# Patient Record
Sex: Female | Born: 1995 | Race: White | Hispanic: No | Marital: Single | Smoking: Never smoker
Health system: Southern US, Community
[De-identification: ages and names within clinical notes are randomized; demographics above are authoritative.]

---
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
## 2017-09-25 ENCOUNTER — Encounter (HOSPITAL_COMMUNITY): Payer: Self-pay | Admitting: Emergency Medicine

## 2017-09-25 ENCOUNTER — Encounter (HOSPITAL_COMMUNITY): Payer: Self-pay | Admitting: *Deleted

## 2017-09-25 ENCOUNTER — Emergency Department (HOSPITAL_COMMUNITY)
Admission: EM | Admit: 2017-09-25 | Discharge: 2017-09-25 | Disposition: A | Discharge status: Other Acute Inpatient Hospital | Payer: Self-pay | Attending: Emergency Medicine | Admitting: Emergency Medicine

## 2017-09-25 ENCOUNTER — Inpatient Hospital Stay (HOSPITAL_COMMUNITY)
Admission: AD | Admit: 2017-09-25 | Discharge: 2017-09-29 | DRG: 885 | Disposition: A | Discharge status: Home / Self Care | Payer: 59 | Attending: Psychiatry | Admitting: Psychiatry

## 2017-09-25 DIAGNOSIS — F419 Anxiety disorder, unspecified: Secondary | ICD-10-CM | POA: Diagnosis not present

## 2017-09-25 DIAGNOSIS — Z008 Encounter for other general examination: Secondary | ICD-10-CM

## 2017-09-25 DIAGNOSIS — F411 Generalized anxiety disorder: Secondary | ICD-10-CM | POA: Diagnosis present

## 2017-09-25 DIAGNOSIS — F332 Major depressive disorder, recurrent severe without psychotic features: Secondary | ICD-10-CM | POA: Diagnosis present

## 2017-09-25 DIAGNOSIS — G4700 Insomnia, unspecified: Secondary | ICD-10-CM | POA: Diagnosis present

## 2017-09-25 DIAGNOSIS — R630 Anorexia: Secondary | ICD-10-CM | POA: Insufficient documentation

## 2017-09-25 DIAGNOSIS — Z915 Personal history of self-harm: Secondary | ICD-10-CM

## 2017-09-25 DIAGNOSIS — R45851 Suicidal ideations: Secondary | ICD-10-CM | POA: Diagnosis present

## 2017-09-25 DIAGNOSIS — Z046 Encounter for general psychiatric examination, requested by authority: Secondary | ICD-10-CM | POA: Insufficient documentation

## 2017-09-25 DIAGNOSIS — F39 Unspecified mood [affective] disorder: Secondary | ICD-10-CM | POA: Diagnosis not present

## 2017-09-25 DIAGNOSIS — X789XXA Intentional self-harm by unspecified sharp object, initial encounter: Secondary | ICD-10-CM | POA: Insufficient documentation

## 2017-09-25 DIAGNOSIS — R450 Nervousness: Secondary | ICD-10-CM | POA: Diagnosis not present

## 2017-09-25 DIAGNOSIS — F322 Major depressive disorder, single episode, severe without psychotic features: Secondary | ICD-10-CM | POA: Diagnosis present

## 2017-09-25 DIAGNOSIS — R110 Nausea: Secondary | ICD-10-CM | POA: Insufficient documentation

## 2017-09-25 LAB — CBC WITH DIFFERENTIAL/PLATELET
Basophils Absolute: 0 10*3/uL (ref 0.0–0.1)
Basophils Relative: 0 %
EOS ABS: 0 10*3/uL (ref 0.0–0.7)
Eosinophils Relative: 0 %
HCT: 39.6 % (ref 36.0–46.0)
HEMOGLOBIN: 13.1 g/dL (ref 12.0–15.0)
LYMPHS ABS: 1.1 10*3/uL (ref 0.7–4.0)
LYMPHS PCT: 19 %
MCH: 29.6 pg (ref 26.0–34.0)
MCHC: 33.1 g/dL (ref 30.0–36.0)
MCV: 89.4 fL (ref 78.0–100.0)
Monocytes Absolute: 0.3 10*3/uL (ref 0.1–1.0)
Monocytes Relative: 5 %
NEUTROS PCT: 76 %
Neutro Abs: 4.5 10*3/uL (ref 1.7–7.7)
Platelets: 220 10*3/uL (ref 150–400)
RBC: 4.43 MIL/uL (ref 3.87–5.11)
RDW: 12.1 % (ref 11.5–15.5)
WBC: 6 10*3/uL (ref 4.0–10.5)

## 2017-09-25 LAB — RAPID URINE DRUG SCREEN, HOSP PERFORMED
AMPHETAMINES: NOT DETECTED
BARBITURATES: NOT DETECTED
BENZODIAZEPINES: NOT DETECTED
Cocaine: NOT DETECTED
Opiates: NOT DETECTED
TETRAHYDROCANNABINOL: NOT DETECTED

## 2017-09-25 LAB — COMPREHENSIVE METABOLIC PANEL
ALBUMIN: 4.3 g/dL (ref 3.5–5.0)
ALK PHOS: 53 U/L (ref 38–126)
ALT: 12 U/L — AB (ref 14–54)
AST: 17 U/L (ref 15–41)
Anion gap: 11 (ref 5–15)
BUN: 18 mg/dL (ref 6–20)
CALCIUM: 9.5 mg/dL (ref 8.9–10.3)
CO2: 22 mmol/L (ref 22–32)
CREATININE: 0.93 mg/dL (ref 0.44–1.00)
Chloride: 103 mmol/L (ref 101–111)
GFR calc non Af Amer: >60 mL/min (ref >60)
GLUCOSE: 79 mg/dL (ref 65–99)
Potassium: 4.3 mmol/L (ref 3.5–5.1)
SODIUM: 136 mmol/L (ref 135–145)
Total Bilirubin: 1 mg/dL (ref 0.3–1.2)
Total Protein: 7.7 g/dL (ref 6.5–8.1)

## 2017-09-25 LAB — SALICYLATE LEVEL: Salicylate Lvl: <7 mg/dL (ref 2.8–30.0)

## 2017-09-25 LAB — ETHANOL: Alcohol, Ethyl (B): <10 mg/dL (ref <10)

## 2017-09-25 LAB — I-STAT BETA HCG BLOOD, ED (MC, WL, AP ONLY)

## 2017-09-25 LAB — ACETAMINOPHEN LEVEL: Acetaminophen (Tylenol), Serum: <10 ug/mL — ABNORMAL LOW (ref 10–30)

## 2017-09-25 MED ORDER — TRAZODONE HCL 50 MG PO TABS
50.0000 mg | ORAL_TABLET | Freq: Every evening | ORAL | Status: DC | PRN
  Administered 2017-09-25: 50 mg via ORAL
  Filled 2017-09-25 (×2): qty 1

## 2017-09-25 MED ORDER — HYDROXYZINE HCL 25 MG PO TABS
25.0000 mg | ORAL_TABLET | Freq: Four times a day (QID) | ORAL | Status: DC | PRN
  Administered 2017-09-25: 25 mg via ORAL
  Filled 2017-09-25: qty 1

## 2017-09-25 MED ORDER — TETANUS-DIPHTH-ACELL PERTUSSIS 5-2.5-18.5 LF-MCG/0.5 IM SUSP: 0.5000 mL | Freq: Once | INTRAMUSCULAR | Status: DC

## 2017-09-25 MED ORDER — ALUM & MAG HYDROXIDE-SIMETH 200-200-20 MG/5ML PO SUSP: 30.0000 mL | Freq: Four times a day (QID) | ORAL | Status: DC | PRN

## 2017-09-25 MED ORDER — ONDANSETRON HCL 4 MG PO TABS: 4.0000 mg | ORAL_TABLET | Freq: Three times a day (TID) | ORAL | Status: DC

## 2017-09-25 MED ORDER — FAMOTIDINE 20 MG PO TABS
20.0000 mg | ORAL_TABLET | Freq: Every day | ORAL | Status: DC
  Administered 2017-09-25: 20 mg via ORAL
  Filled 2017-09-25: qty 1

## 2017-09-25 MED ORDER — MAGNESIUM HYDROXIDE 400 MG/5ML PO SUSP: 30.0000 mL | Freq: Every day | ORAL | Status: DC | PRN

## 2017-09-25 MED ORDER — HYDROXYZINE HCL 25 MG PO TABS
25.0000 mg | ORAL_TABLET | Freq: Three times a day (TID) | ORAL | Status: DC | PRN
  Administered 2017-09-27 – 2017-09-28 (×2): 25 mg via ORAL
  Filled 2017-09-25 (×2): qty 1

## 2017-09-25 MED ORDER — ZOLPIDEM TARTRATE 5 MG PO TABS: 5.0000 mg | ORAL_TABLET | Freq: Every evening | ORAL | Status: DC | PRN

## 2017-09-25 MED ORDER — ALUM & MAG HYDROXIDE-SIMETH 200-200-20 MG/5ML PO SUSP: 30.0000 mL | ORAL | Status: DC | PRN

## 2017-09-25 MED ORDER — ACETAMINOPHEN 325 MG PO TABS: 650.0000 mg | ORAL_TABLET | ORAL | Status: DC | PRN

## 2017-09-25 MED ORDER — CITALOPRAM HYDROBROMIDE 10 MG PO TABS
10.0000 mg | ORAL_TABLET | Freq: Every day | ORAL | Status: DC
  Administered 2017-09-25: 10 mg via ORAL
  Filled 2017-09-25: qty 1

## 2017-09-25 MED ORDER — IBUPROFEN 400 MG PO TABS
400.0000 mg | ORAL_TABLET | Freq: Four times a day (QID) | ORAL | Status: DC | PRN
  Administered 2017-09-25 – 2017-09-27 (×2): 400 mg via ORAL
  Filled 2017-09-25 (×2): qty 1

## 2017-09-25 NOTE — ED Provider Notes (Signed)
[facility]-EMERGENCY DEPT Provider Note   CSN: [id number] Arrival date & time: 09/25/17  1232     History   Chief Complaint Chief Complaint  Patient presents with  . Suicidal    IVC'd    HPI Diamond West is a 21 y.o. female who presents to the ED under IVC for SI with a plan to "bleed out" of a cut on her leg.  Patient states that over the last 6 months she has been under a lot of stress related to family and school, and has been feeling more depressed and suicidal.  2 days ago she decided to cut her left thigh superficially and intended to "bleed out" from this.  She has also previously attempted overdose several months ago but has not done so today.  Her father obtained IVC paperwork which states "respondent has not been eating and has just been wanting to stay in bed.  Respondent on the 09/23/17 texted a friend that she wanted to die and had thoughts of dying all the time.  Respondent had cut her left leg when police responded that night.  Respondent has lost a significant amount of weight and refuses to eat.  Respondent continues to make statements to father and stepmom about wanting to die.  Respondent has become reclusive and is dissociating from her family and friends."  She states that [facility] but is not prescribed any psychiatric medications.  She denies HI, AVH, drug use, alcohol use, or tobacco use.  She reports that she gets nauseous after eating and has lost her appetite due to this.  She denies any pain related to this.  She denies any other medical complaints at this time, and denies any symptoms of infection around the cuts on her leg.  She is unsure of her last tetanus shot.   The history is provided by the patient and medical records. No language interpreter was used.  Mental Health Problem  Presenting symptoms: depression, self-mutilation and suicidal thoughts   Presenting symptoms: no hallucinations and no homicidal  ideas   Patient accompanied by:  Law enforcement Onset quality:  Gradual Duration:  6 months Timing:  Constant Progression:  Worsening Chronicity:  Recurrent Context: stressful life event   Treatment compliance:  Untreated Relieved by:  None tried Exacerbated by: stress, family, school. Ineffective treatments:  None tried Associated symptoms: no abdominal pain and no chest pain   Risk factors: hx of suicide attempts   Risk factors: no recent psychiatric admission     History reviewed. No pertinent past medical history.  There are no active problems to display for this patient.   History reviewed. No pertinent surgical history.  OB History    No data available       Home Medications    Prior to Admission medications   Not on File    Family History No family history on file.  Social History Social History   Tobacco Use  . Smoking status: Never Smoker  Substance Use Topics  . Alcohol use: No  . Drug use: Not on file     Allergies   Patient has no known allergies.   Review of Systems Review of Systems  Constitutional: Negative for chills and fever.  Respiratory: Negative for shortness of breath.   Cardiovascular: Negative for chest pain.  Gastrointestinal: Positive for nausea (after eating). Negative for abdominal pain, constipation, diarrhea and vomiting.  Genitourinary: Negative for dysuria and hematuria.  Musculoskeletal: Negative  for arthralgias and myalgias.  Skin: Negative for color change.  Allergic/Immunologic: Negative for immunocompromised state.  Neurological: Negative for weakness and numbness.  Psychiatric/Behavioral: Positive for self-injury and suicidal ideas. Negative for confusion, hallucinations and homicidal ideas.   All other systems reviewed and are negative for acute change except as noted in the HPI.    Physical Exam Updated Vital Signs BP 113/81 (BP Location: Left Arm)   Pulse (!) 104   Temp 98.7 F (37.1 C) (Oral)   Resp  17   SpO2 98%   Physical Exam  Constitutional: She is oriented to person, place, and time. Vital signs are normal. She appears well-developed and well-nourished.  Non-toxic appearance. No distress.  Afebrile, nontoxic, NAD, thin, pleasant, very quiet  HENT:  Head: Normocephalic and atraumatic.  Mouth/Throat: Oropharynx is clear and moist and mucous membranes are normal.  Eyes: Conjunctivae and EOM are normal. Right eye exhibits no discharge. Left eye exhibits no discharge.  Neck: Normal range of motion. Neck supple.  Cardiovascular: Normal rate, regular rhythm, normal heart sounds and intact distal pulses. Exam reveals no gallop and no friction rub.  No murmur heard. Pulmonary/Chest: Effort normal and breath sounds normal. No respiratory distress. She has no decreased breath sounds. She has no wheezes. She has no rhonchi. She has no rales.  Abdominal: Soft. Normal appearance and bowel sounds are normal. She exhibits no distension. There is no tenderness. There is no rigidity, no rebound, no guarding, no CVA tenderness, no tenderness at McBurney's point and negative Murphy's sign.  Soft, NTND, +BS throughout, no r/g/r, neg murphy's, neg mcburney's, no CVA TTP   Musculoskeletal: Normal range of motion.  Neurological: She is alert and oriented to person, place, and time. She has normal strength. No sensory deficit.  Skin: Skin is warm and dry. Abrasion noted. No rash noted.  Small linear abrasions to L thigh, no surrounding erythema or warmth, no drainage, no signs of infection.   Psychiatric: She is not actively hallucinating. She exhibits a depressed mood. She expresses suicidal ideation. She expresses no homicidal ideation. She expresses suicidal plans. She expresses no homicidal plans.  Depressed affect, but pleasant and cooperative. Endorsing SI with a plan, denies HI or AVH, doesn't seem to be responding to internal stimuli.   Nursing note and vitals reviewed.    ED Treatments / Results    Labs (all labs ordered are listed, but only abnormal results are displayed) Labs Reviewed  COMPREHENSIVE METABOLIC PANEL - Abnormal; Notable for the following components:      Result Value   ALT 12 (*)    All other components within normal limits  ACETAMINOPHEN LEVEL - Abnormal; Notable for the following components:   Acetaminophen (Tylenol), Serum <10 (*)    All other components within normal limits  CBC WITH DIFFERENTIAL/PLATELET  ETHANOL  SALICYLATE LEVEL  RAPID URINE DRUG SCREEN, HOSP PERFORMED  I-STAT BETA HCG BLOOD, ED ([facility], [facility], [facility] ONLY)    EKG  EKG Interpretation None       Radiology No results found.  Procedures Procedures (including critical care time)  Medications Ordered in ED Medications  acetaminophen (TYLENOL) tablet 650 mg (not administered)  zolpidem (AMBIEN) tablet 5 mg (not administered)  ondansetron (ZOFRAN) tablet 4 mg (0 mg Oral Hold 09/25/17 1353)  alum & mag hydroxide-simeth (MAALOX/MYLANTA) 200-200-20 MG/5ML suspension 30 mL (not administered)  famotidine (PEPCID) tablet 20 mg (0 mg Oral Hold 09/25/17 1353)  Tdap (BOOSTRIX) injection 0.5 mL (0.5 mLs Intramuscular Not Given 09/25/17 1357)  Initial Impression / Assessment and Plan / ED Course  I have reviewed the triage vital signs and the nursing notes.  Pertinent labs & imaging results that were available during my care of the patient were reviewed by me and considered in my medical decision making (see chart for details).     21 y.o. female here under IVC due to SI with a plan to cut herself. Cut her LLE (superficial abrasions) 2 days ago, has hx of attempted OD a few months ago. Denies HI/AVH, no drugs/EtOH/tobacco. Has had loss of appetite due to postprandial nausea, no pain with it. Otherwise no medical complaints. No psych meds at home, [facility]. On exam, pleasant and cooperative, depressed affect, very quiet, thin, no abdominal tenderness. Small superficial  abrasions to LLE, no surrounding signs of infection. Will update Tdap, get screening labs, and TTS consult. For the postprandial nausea, will pre-treat with zofran TID with meals, and start on pepcid while here. Discussed that she needs to f/up outpatient for further work up of this. Suspect it's possible that some psychologic condition such as anorexia could also be to blame, will defer to psych for this discussion/assessment. Will reassess after labs return.   3:01 PM CBC w/diff WNL. CMP WNL. EtOH level undetectable. Salicylate and acetaminophen levels WNL. BetaHCG neg. UDS neg. Pt medically cleared at this time. Psych hold orders and home med orders placed. Please see TTS notes for further documentation of care/dispo. Pt stable at time of med clearance.     Final Clinical Impressions(s) / ED Diagnoses   Final diagnoses:  Suicidal ideation  Involuntary commitment  Medical clearance for psychiatric admission  Postprandial nausea    ED Discharge Orders    [address]       Clyde Zarrella, [place]cedes, [place]PA-C 09/25/17 1501    Alvira MondaySchlossman, Erin, MD 09/27/17 [phone]

## 2017-09-25 NOTE — ED Notes (Signed)
Report called to behavioral health, nurse Karen KitchensBobbie.  Patient to be transported by police after 7pm.

## 2017-09-25 NOTE — ED Notes (Signed)
Orders received to transfer patient. Pt aware and discharge summery reviewed with patient as well as review of medications. All personal items return and patient signed all discharge paperwork. Pt escorted off unit with sheriff [facility].

## 2017-09-25 NOTE — ED Notes (Signed)
Police called for transport after 7:30.

## 2017-09-25 NOTE — ED Notes (Signed)
TTS at bedside. 

## 2017-09-25 NOTE — ED Notes (Signed)
Patient ate 50% of her dinner.  Taking fluids without difficulty.

## 2017-09-25 NOTE — ED Triage Notes (Signed)
[facility]. States that patient has not been eating and has been wanting to stay in bed. Reports that patient text a friend about thoughts of wanting to hurt self. Has lost weight. Cut self on left leg. Suicidal with plan to "bleed out".

## 2017-09-25 NOTE — BH Assessment (Signed)
[facility] Assessment Progress Note  Case was staffed with Lord DNP who recommended a inpatient admission as appropriate bed placement is investigated.         

## 2017-09-25 NOTE — ED Notes (Addendum)
SBAR Report received from previous nurse. Pt received calm and visible on unit. Pt  appears otherwise stable and free of distress. Report and transport arranged by previous RN awaiting [employer] for transport. Pt reminded of camera surveillance, q 15 min rounds, and rules of the milieu. Will continue to assess.

## 2017-09-25 NOTE — BH Assessment (Addendum)
Assessment Note  Diamond West is an 21 y.o. female that presents this date with IVC. Per IVC: " Respondent has not been eating and just wanting to stay in bed. Respondent on 09/23/17 text a friend that she wanted to die and had thoughts of dying all the time. Respondent had cut her left leg when police responded that night. Respondent has lost weight due to not eating. Respondent continues to make statements to father and step mom about wanting to die". Patient presents with S/I this date reporting that she cut her leg with the intent to "bleed out." Patient states she continues to have thoughts of self harm at the time of admission. Patient reports she has never been hospitalized but states she has attempted to cut herself "4 or more times in her life." Patient states all the cuts were superficial although at the time patient reports that she "thought they were enough to end her life." Patient denies any history of OP treatment or medication management. Patient reports she feels she has been "depressed for years" with symptoms increasing within the last year due to school stress. Patient reports symptoms to include: anhedonia, fatigue and isolating. Patient also reports decreased appetite although is unsure in reference to weight loss. Patient is time/place oriented and denies any H/I or AVH.  Patient reports that she is currently seeing a counselor at [facility] but has only seen them "a couple times." Patient states she has been unwilling to seek treatment because "she really doesn't care." Patient is observed to be tearful during assessment and states she would like to "get some help I guess." Patient is open to medication and therapeutic  Interventions. Patient renders limited history and seems to disassociate at times. Per notes, "Patient presents to the ED under IVC for SI with a plan to "bleed out" of a cut on her leg. Patient states that over the last 6 months she has been under a lot of stress  related to family and school, and has been feeling more depressed and suicidal.  Two days ago she decided to cut her left thigh superficially and intended to "bleed out" from this.  She has also previously attempted overdose several months ago but has not done so today. Her father obtained IVC paperwork which states "respondent has not been eating and has just been wanting to stay in bed. Respondent on the 09/23/17 texted a friend that she wanted to die and had thoughts of dying all the time. Respondent had cut her left leg when police responded that night. Respondent has lost a significant amount of weight and refuses to eat. Respondent continues to make statements to father and step mom about wanting to die. Respondent has become reclusive and is dissociating from her family and friends." She states that she sees a Veterinary surgeon at [facility] but is not prescribed any psychiatric medications. She denies HI, AVH, drug use, alcohol use, or tobacco use. She reports that she gets nauseous after eating and has lost her appetite due to this".  Case was staffed with Shaune Pollack DNP who recommended a inpatient admission as appropriate bed placement is investigated.     Diagnosis: F33.2  MDD recurrent severe without psychotic features  Past Medical History: History reviewed. No pertinent past medical history.  History reviewed. No pertinent surgical history.  Family History: No family history on file.  Social History:  reports that  has never smoked. She does not have any smokeless tobacco history on file. She reports that  she does not drink alcohol. Her drug history is not on file.  Additional Social History:  Alcohol / Drug Use Pain Medications: See MAR Prescriptions: See MAR Over the Counter: See MAR History of alcohol / drug use?: No history of alcohol / drug abuse Longest period of sobriety (when/how long): NA Negative Consequences of Use: (NA) Withdrawal Symptoms: (NA)  CIWA: CIWA-Ar BP: 113/81 Pulse  Rate: (!) 104 COWS:    Allergies: No Known Allergies  Home Medications:  (Not in a hospital admission)  OB/GYN Status:  No LMP recorded.  General Assessment Data Location of Assessment: [facility] ED TTS Assessment: In system Is this a Tele or Face-to-Face Assessment?: Face-to-Face Is this an Initial Assessment or a Re-assessment for this encounter?: Initial Assessment Marital status: Single Maiden name: NA Is patient pregnant?: No Pregnancy Status: No Living Arrangements: Non-relatives/Friends Can pt return to current living arrangement?: Yes Admission Status: Involuntary Is patient capable of signing voluntary admission?: Yes Referral Source: Self/Family/Friend Insurance type: [employer]  Medical Screening Exam [facility] Walk-in ONLY) Medical Exam completed: Yes  Crisis Care Plan Living Arrangements: Non-relatives/Friends Legal Guardian: (NA) Name of Psychiatrist: None [facility]  Education Status Is patient currently in school?: Yes Current Grade: (3rd year college) Highest grade of school patient has completed: (2nd year) Name of school: [employer]) Contact person: NA  Risk to self with the past 6 months Suicidal Ideation: Yes-Currently Present Has patient been a risk to self within the past 6 months prior to admission? : Yes Suicidal Intent: Yes-Currently Present Has patient had any suicidal intent within the past 6 months prior to admission? : Yes Is patient at risk for suicide?: Yes Suicidal Plan?: Yes-Currently Present Has patient had any suicidal plan within the past 6 months prior to admission? : No Specify Current Suicidal Plan: Cut right leg Access to Means: Yes Specify Access to Suicidal Means: Knife from home What has been your use of drugs/alcohol within the last 12 months?: Denies Previous Attempts/Gestures: Yes How many times?: 4 Other Self Harm Risks: NA Triggers for Past Attempts: Other (Comment)(School issues) Intentional Self Injurious  Behavior: None Family Suicide History: No Recent stressful life event(s): Other (Comment)(School issues) Persecutory voices/beliefs?: No Depression: Yes Depression Symptoms: Feeling worthless/self pity Substance abuse history and/or treatment for substance abuse?: No Suicide prevention information given to non-admitted patients: Not applicable  Risk to Others within the past 6 months Homicidal Ideation: No Does patient have any lifetime risk of violence toward others beyond the six months prior to admission? : No Thoughts of Harm to Others: No Current Homicidal Intent: No Current Homicidal Plan: No Access to Homicidal Means: No Identified Victim: NA History of harm to others?: No Assessment of Violence: None Noted Violent Behavior Description: NA Does patient have access to weapons?: No Criminal Charges Pending?: No Does patient have a court date: No Is patient on probation?: No  Psychosis Hallucinations: None noted Delusions: None noted  Mental Status Report Appearance/Hygiene: In scrubs Eye Contact: Fair Motor Activity: Freedom of movement Speech: Slow Level of Consciousness: Quiet/awake Mood: Sad Affect: Depressed Anxiety Level: Moderate Thought Processes: Coherent, Relevant Judgement: Unimpaired Orientation: Person, Place, Time Obsessive Compulsive Thoughts/Behaviors: None  Cognitive Functioning Concentration: Normal Memory: Recent Intact, Remote Intact IQ: Average Insight: Fair Impulse Control: Poor Appetite: Poor Weight Loss: 5 Weight Gain: 0 Sleep: Decreased Total Hours of Sleep: 5 Vegetative Symptoms: None  ADLScreening [facility] Assessment [facility]) Patient's cognitive ability adequate to safely complete daily activities?: Yes Patient able to express need  for assistance with ADLs?: Yes Independently performs ADLs?: Yes (appropriate for developmental age)  Prior Inpatient Therapy Prior Inpatient Therapy: No Prior Therapy Dates: NA Prior Therapy  Facilty/Provider(s): NA Reason for Treatment: NA  Prior Outpatient Therapy Prior Outpatient Therapy: No Prior Therapy Dates: NA Prior Therapy Facilty/Provider(s): NA Reason for Treatment: NA Does patient have an ACCT team?: No Does patient have Intensive In-House Services?  : No Does patient have [facility] services? : No Does patient have P4CC services?: No  ADL Screening (condition at time of admission) Patient's cognitive ability adequate to safely complete daily activities?: Yes Is the patient deaf or have difficulty hearing?: No Does the patient have difficulty seeing, even when wearing glasses/contacts?: No Does the patient have difficulty concentrating, remembering, or making decisions?: No Patient able to express need for assistance with ADLs?: Yes Does the patient have difficulty dressing or bathing?: No Independently performs ADLs?: Yes (appropriate for developmental age) Does the patient have difficulty walking or climbing stairs?: No Weakness of Legs: None Weakness of Arms/Hands: None  Home Assistive Devices/Equipment Home Assistive Devices/Equipment: None  Therapy Consults (therapy consults require a physician order) PT Evaluation Needed: No OT Evalulation Needed: No SLP Evaluation Needed: No Abuse/Neglect Assessment (Assessment to be complete while patient is alone) Physical Abuse: Denies Verbal Abuse: Denies Sexual Abuse: Denies Exploitation of patient/patient's resources: Denies Self-Neglect: Denies Values / Beliefs Cultural Requests During Hospitalization: None Spiritual Requests During Hospitalization: None Consults Spiritual Care Consult Needed: No Social Work Consult Needed: No Merchant navy officerAdvance Directives (For Healthcare) Does Patient Have a Medical Advance Directive?: No Would patient like information on creating a medical advance directive?: No - Patient declined    Additional Information 1:1 In Past 12 Months?: No CIRT Risk: No Elopement Risk: No Does  patient have medical clearance?: Yes     Disposition: Case was staffed with Shaune PollackLord DNP who recommended a inpatient admission as appropriate bed placement is investigated.  Disposition Initial Assessment Completed for this Encounter: Yes Disposition of Patient: Inpatient treatment program Type of inpatient treatment program: Adult  On Site Evaluation by:   Reviewed with Physician:    Alfredia Fergusonavid L Morghan Kester 09/25/2017 3:24 PM

## 2017-09-26 ENCOUNTER — Other Ambulatory Visit: Payer: Self-pay

## 2017-09-26 DIAGNOSIS — F332 Major depressive disorder, recurrent severe without psychotic features: Principal | ICD-10-CM

## 2017-09-26 DIAGNOSIS — R450 Nervousness: Secondary | ICD-10-CM

## 2017-09-26 DIAGNOSIS — F419 Anxiety disorder, unspecified: Secondary | ICD-10-CM

## 2017-09-26 MED ORDER — CITALOPRAM HYDROBROMIDE 10 MG PO TABS
10.0000 mg | ORAL_TABLET | Freq: Every day | ORAL | Status: DC
  Administered 2017-09-26 – 2017-09-29 (×4): 10 mg via ORAL
  Filled 2017-09-26 (×7): qty 1

## 2017-09-26 NOTE — Progress Notes (Signed)
Patient ID: Diamond CutterVictoria B West, female   DOB: [birth year], 21 y.o.   MRN: [id number]   D: Patient complaining of headache and anxiety when brought back to the unit. Ibuprofen and Trazodone given at this time to help headache and to sleep. Does have vistaril if anxious while out of bed.  A: Staff will monitor on q 15 minute checks, follow treatment plan, and give medication as ordered. R: Cooperative on the unit.

## 2017-09-26 NOTE — Tx Team (Signed)
Initial Treatment Plan 09/26/2017 12:05 AM Diamond West ZOX:[id number]    PATIENT STRESSORS: Educational concerns Marital or family conflict   PATIENT STRENGTHS: Ability for insight Average or above average intelligence Capable of independent living General fund of knowledge Motivation for treatment/growth Supportive family/friends   PATIENT IDENTIFIED PROBLEMS: Depression Anxiety Suicidal thoughts "Suicidal thoughts are a big thing and I have really bad anxiety"                     DISCHARGE CRITERIA:  Ability to meet basic life and health needs Improved stabilization in mood, thinking, and/or behavior Reduction of life-threatening or endangering symptoms to within safe limits Verbal commitment to aftercare and medication compliance  PRELIMINARY DISCHARGE PLAN: Attend aftercare/continuing care group Return to previous living arrangement  PATIENT/FAMILY INVOLVEMENT: This treatment plan has been presented to and reviewed with the patient, Diamond West, and/or family member, .  The patient and family have been given the opportunity to ask questions and make suggestions.  [name], [place], [name] 09/26/2017, 12:05 AM

## 2017-09-26 NOTE — H&P (Addendum)
Psychiatric Admission Assessment Adult  Patient Identification: Diamond West MRN:  [id number] Date of Evaluation:  09/26/2017 Chief Complaint: " I called my dad asking for help" Principal Diagnosis: MDD, no psychotic symptoms.   Diagnosis:   Patient Active Problem List   Diagnosis Date Noted  . Major depressive disorder, single episode, severe without psychotic features ([place]) [F32.2] 09/25/2017  . Severe recurrent major depression without psychotic features [facility]) [F33.2] 09/25/2017   History of Present Illness: Patient is 21 year old female, college student, currently staying with her father  for Winter break. Admitted under commitment generated by her father. States she has a history of depression, anxiety.States she recently called her parents due to worsening depression and recent episode of self cutting . States she has a history of one prior episode of self cutting two years ago. Endorses passive SI, such as  Thinking " I hope I do not wake up", but denies actual suicidal plans or intention. States that in addition to depression, she feels that anxiety has been a major issue for her as well . States she feels she worries excessively . Endorses neuro-vegetative symptoms of depression as below, including significant weight loss recently . Identifies recent relationship break up and recent final exams/academic stressors as contributors to her depression.   Associated Signs/Symptoms: Depression Symptoms:  depressed mood, anhedonia, insomnia, suicidal thoughts with specific plan, anxiety, loss of energy/fatigue, decreased appetite, has lost 10 lbs over recent weeks (Hypo) Manic Symptoms:  Denies  Anxiety Symptoms: increased anxiety, denies panic attacks, denies agoraphobia. Psychotic Symptoms:    Denies  PTSD Symptoms: Denies  Total Time spent with patient: 45 minutes  Past Psychiatric History: no prior psychiatric admissions, reports history of depression particularly over  the last 6 months, denies history of mania, hypomania . Denies history of psychosis. Denies history of violence. Does not endorse history of PTSD . Denies history of eating disorder.  Denies history of repeated self cutting, but states she has cut self once before (two years ago)   Is the patient at risk to self? Yes.    Has the patient been a risk to self in the past 6 months? Yes.    Has the patient been a risk to self within the distant past? Yes.    Is the patient a risk to others? No.  Has the patient been a risk to others in the past 6 months? No.  Has the patient been a risk to others within the distant past? No.   Prior Inpatient Therapy:  denies  Prior Outpatient Therapy:  has been going to a counselor at [facility] over recent months   Alcohol Screening: 1. How often do you have a drink containing alcohol?: Monthly or less 2. How many drinks containing alcohol do you have on a typical day when you are drinking?: 1 or 2 3. How often do you have six or more drinks on one occasion?: Never AUDIT-C Score: 1 4. How often during the last year have you found that you were not able to stop drinking once you had started?: Never 5. How often during the last year have you failed to do what was normally expected from you becasue of drinking?: Never 6. How often during the last year have you needed a first drink in the morning to get yourself going after a heavy drinking session?: Never 7. How often during the last year have you had a feeling of guilt of remorse after drinking?: Never 8. How often during  the last year have you been unable to remember what happened the night before because you had been drinking?: Never 9. Have you or someone else been injured as a result of your drinking?: No 10. Has a relative or friend or a doctor or another health worker been concerned about your drinking or suggested you cut down?: No Alcohol Use Disorder Identification Test Final Score (AUDIT):  1 Intervention/Follow-up: AUDIT Score <7 follow-up not indicated Substance Abuse History in the last 12 months:  Denies alcohol or drug abuse  Consequences of Substance Abuse: Denies  Previous Psychotropic Medications: states she was on Abilify briefly ( one week) 2 years ago. No other psychiatric medication trials  Psychological Evaluations:  No  Past Medical History:  Denies medical illnesses , NKDA, does not smoke or use tobacco products  Family History: Parents are divorced, states she is closer to father, has one older sister, and three half siblings  Family Psychiatric  History:  States half sister has history of Eating Disorder, no suicides in family, no substance abuse in family  Tobacco Screening: Have you used any form of tobacco in the last 30 days? (Cigarettes, Smokeless Tobacco, Cigars, and/or Pipes): No Social History: 21 year old single female, no children, in college [facility] [employer], currently on winter break , lives in college housing but is currently staying with parents . Employed in [place] .  Social History   Substance and Sexual Activity  Alcohol Use No     Social History   Substance and Sexual Activity  Drug Use No    Additional Social History:  Allergies:  No Known Allergies Lab Results:  Results for orders placed or performed during the hospital encounter of 09/25/17 (from the past 48 hour(s))  Urine rapid drug screen (hosp performed)     Status: None   Collection Time: 09/25/17  1:35 PM  Result Value Ref Range   Opiates NONE DETECTED NONE DETECTED   Cocaine NONE DETECTED NONE DETECTED   Benzodiazepines NONE DETECTED NONE DETECTED   Amphetamines NONE DETECTED NONE DETECTED   Tetrahydrocannabinol NONE DETECTED NONE DETECTED   Barbiturates NONE DETECTED NONE DETECTED    Comment:        DRUG SCREEN FOR MEDICAL PURPOSES ONLY.  IF CONFIRMATION IS NEEDED FOR ANY PURPOSE, NOTIFY LAB WITHIN 5 DAYS.        LOWEST DETECTABLE LIMITS FOR URINE DRUG  SCREEN Drug Class       Cutoff (ng/mL) Amphetamine      1000 Barbiturate      200 Benzodiazepine   240 Tricyclics       973 Opiates          300 Cocaine          300 THC              50   CBC w/diff     Status: None   Collection Time: 09/25/17  1:36 PM  Result Value Ref Range   WBC 6.0 4.0 - 10.5 K/uL   RBC 4.43 3.87 - 5.11 MIL/uL   Hemoglobin 13.1 12.0 - 15.0 g/dL   HCT 39.6 36.0 - 46.0 %   MCV 89.4 78.0 - 100.0 fL   MCH 29.6 26.0 - 34.0 pg   MCHC 33.1 30.0 - 36.0 g/dL   RDW 12.1 11.5 - 15.5 %   Platelets 220 150 - 400 K/uL   Neutrophils Relative % 76 %   Neutro Abs 4.5 1.7 - 7.7 K/uL   Lymphocytes Relative  19 %   Lymphs Abs 1.1 0.7 - 4.0 K/uL   Monocytes Relative 5 %   Monocytes Absolute 0.3 0.1 - 1.0 K/uL   Eosinophils Relative 0 %   Eosinophils Absolute 0.0 0.0 - 0.7 K/uL   Basophils Relative 0 %   Basophils Absolute 0.0 0.0 - 0.1 K/uL  Comprehensive metabolic panel     Status: Abnormal   Collection Time: 09/25/17  1:36 PM  Result Value Ref Range   Sodium 136 135 - 145 mmol/L   Potassium 4.3 3.5 - 5.1 mmol/L   Chloride 103 101 - 111 mmol/L   CO2 22 22 - 32 mmol/L   Glucose, Bld 79 65 - 99 mg/dL   BUN 18 6 - 20 mg/dL   Creatinine, Ser 0.93 0.44 - 1.00 mg/dL   Calcium 9.5 8.9 - 10.3 mg/dL   Total Protein 7.7 6.5 - 8.1 g/dL   Albumin 4.3 3.5 - 5.0 g/dL   AST 17 15 - 41 U/L   ALT 12 (L) 14 - 54 U/L   Alkaline Phosphatase 53 38 - 126 U/L   Total Bilirubin 1.0 0.3 - 1.2 mg/dL   GFR calc non Af Amer >60 >60 mL/min   GFR calc Af Amer >60 >60 mL/min    Comment: (NOTE) The eGFR has been calculated using the CKD EPI equation. This calculation has not been validated in all clinical situations. eGFR's persistently <60 mL/min signify possible Chronic Kidney Disease.    Anion gap 11 5 - 15  Ethanol     Status: None   Collection Time: 09/25/17  1:36 PM  Result Value Ref Range   Alcohol, Ethyl (B) <10 <10 mg/dL    Comment:        LOWEST DETECTABLE LIMIT FOR SERUM  ALCOHOL IS 10 mg/dL FOR MEDICAL PURPOSES ONLY   Salicylate level     Status: None   Collection Time: 09/25/17  1:36 PM  Result Value Ref Range   Salicylate Lvl <5.4 2.8 - 30.0 mg/dL  Acetaminophen level     Status: Abnormal   Collection Time: 09/25/17  1:36 PM  Result Value Ref Range   Acetaminophen (Tylenol), Serum <10 (L) 10 - 30 ug/mL    Comment:        THERAPEUTIC CONCENTRATIONS VARY SIGNIFICANTLY. A RANGE OF 10-30 ug/mL MAY BE AN EFFECTIVE CONCENTRATION FOR MANY PATIENTS. HOWEVER, SOME ARE BEST TREATED AT CONCENTRATIONS OUTSIDE THIS RANGE. ACETAMINOPHEN CONCENTRATIONS >150 ug/mL AT 4 HOURS AFTER INGESTION AND >50 ug/mL AT 12 HOURS AFTER INGESTION ARE OFTEN ASSOCIATED WITH TOXIC REACTIONS.   I-Stat beta hCG blood, ED ([facility], [facility], [facility] only)     Status: None   Collection Time: 09/25/17  1:50 PM  Result Value Ref Range   I-stat hCG, quantitative <5.0 <5 mIU/mL   Comment 3            Comment:   GEST. AGE      CONC.  (mIU/mL)   <=1 WEEK        5 - 50     2 WEEKS       50 - 500     3 WEEKS       100 - 10,000     4 WEEKS     1,000 - 30,000        FEMALE AND NON-PREGNANT FEMALE:     LESS THAN 5 mIU/mL     Blood Alcohol level:  Lab Results  Component Value Date   ETH <10 09/25/2017  Metabolic Disorder Labs:  No results found for: HGBA1C, MPG No results found for: PROLACTIN No results found for: CHOL, TRIG, HDL, CHOLHDL, VLDL, LDLCALC  Current Medications: Current Facility-Administered Medications  Medication Dose Route Frequency Provider Last Rate Last Dose  . alum & mag hydroxide-simeth (MAALOX/MYLANTA) 200-200-20 MG/5ML suspension 30 mL  30 mL Oral Q4H PRN Lindon Romp A, NP      . hydrOXYzine (ATARAX/VISTARIL) tablet 25 mg  25 mg Oral TID PRN Lindon Romp A, NP      . ibuprofen (ADVIL,MOTRIN) tablet 400 mg  400 mg Oral Q6H PRN Lindon Romp A, NP   400 mg at 09/25/17 2228  . magnesium hydroxide (MILK OF MAGNESIA) suspension 30 mL  30 mL Oral Daily PRN Lindon Romp  A, NP      . traZODone (DESYREL) tablet 50 mg  50 mg Oral QHS PRN Rozetta Nunnery, NP   50 mg at 09/25/17 2229   PTA Medications: Medications Prior to Admission  Medication Sig Dispense Refill Last Dose  . ibuprofen (ADVIL,MOTRIN) 200 MG tablet Take 400 mg by mouth every 6 (six) hours as needed for moderate pain.   Past Month at Unknown time  . JUNEL FE 1/20 1-20 MG-MCG tablet Take 1 tablet by mouth daily.  2 09/24/2017 at Unknown time    Musculoskeletal: Strength & Muscle Tone: within normal limits Gait & Station: normal Patient leans: N/A  Psychiatric Specialty Exam: Physical Exam  Review of Systems  Constitutional: Negative.   HENT: Negative.   Eyes: Negative.   Respiratory: Negative.   Cardiovascular: Negative.   Gastrointestinal: Positive for nausea. Negative for diarrhea and vomiting.  Genitourinary: Negative.   Musculoskeletal: Negative.   Skin: Negative.   Neurological: Negative for seizures.  Psychiatric/Behavioral: Positive for depression and suicidal ideas. The patient is nervous/anxious.   All other systems reviewed and are negative.   Blood pressure 102/66, pulse (!) 101, temperature 98.6 F (37 C), temperature source Oral, resp. rate 16, height 5' 2"  (1.575 m), weight 49.9 kg (110 lb).Body mass index is 20.12 kg/m.  General Appearance: Well Groomed  Eye Contact:  Good  Speech:  Normal Rate  Volume:  Normal  Mood:  states she feels better, denies feeling significantly depressed, states mood is "OK" today   Affect:  Appropriate and reactive  Thought Process:  Linear and Descriptions of Associations: Intact  Orientation:  Full (Time, Place, and Person)  Thought Content:  denies hallucinations, no delusions, not internally preoccupied   Suicidal Thoughts:  No, denies any suicidal or self injurious ideations, denies homicidal or violent ideations , contracts for safety on unit   Homicidal Thoughts:  No  Memory:  recent and remote grossly intact   Judgement:  Fair   Insight:  Fair  Psychomotor Activity:  Normal  Concentration:  Concentration: Good and Attention Span: Good  Recall:  Good  Fund of Knowledge:  Good  Language:  Good  Akathisia:  No  Handed:  Right  AIMS (if indicated):     Assets:  Desire for Improvement Resilience  ADL's:  Intact  Cognition:  WNL  Sleep:  Number of Hours: 5.75    Treatment Plan Summary: Daily contact with patient to assess and evaluate symptoms and progress in treatment, Medication management, Plan inpatient admission and medications as below  Observation Level/Precautions:  15 minute checks  Laboratory: TSH  Psychotherapy:  Milieu, group therapy   Medications:  We discussed options, agrees to start an antidepressant medication to address anxiety, depression Agrees  to CELEXA start at [place]. Side effects discussed   Consultations: as needed    Discharge Concerns:  -   Estimated LOS: 5 days  Other:     Physician Treatment Plan for Primary Diagnosis:  MDD, no psychotic features  Long Term Goal(s): Improvement in symptoms so as ready for discharge  Short Term Goals: Ability to identify changes in lifestyle to reduce recurrence of condition will improve and Ability to maintain clinical measurements within normal limits will improve  Physician Treatment Plan for Secondary Diagnosis: Consider Generalized Anxiety Disorder  Long Term Goal(s): Improvement in symptoms so as ready for discharge  Short Term Goals: Ability to verbalize feelings will improve, Ability to disclose and discuss suicidal ideas, Ability to demonstrate self-control will improve and Ability to identify and develop effective coping behaviors will improve  I certify that inpatient services furnished can reasonably be expected to improve the patient's condition.    Jenne Campus, MD 12/16/20184:03 PM

## 2017-09-26 NOTE — Progress Notes (Signed)
Patient shared with the group that she had a rough start to her day but that it ended well. The patient credited the positive turn around to the fact that she socialized more with her peers today. As for the theme of the day, her support system will be comprised of her church and friends.

## 2017-09-26 NOTE — Progress Notes (Signed)
Nursing Progress Note: 7-7p  D- Mood is depressed and anxious, pt felt fait and weak this am but after resting and pushing fluids reported feeling better after eating lunch. "I don't know why I felt so weak, if it was the sleeping medication or not eating yesterday." . Affect is blunted and appropriate. Pt is able to contract for safety.    A - Pt was lying down initially but did attend group with encouragement.Observed pt interacting in group and in the milieu.Support and encouragement offered, safety maintained with q 15 minutes.   R-Contracts for safety and continues to follow treatment plan, working on learning new coping skills for anxiety. Encouraged to increase food and fluid intake.

## 2017-09-26 NOTE — Progress Notes (Signed)
Diamond West is a 21 year old female pt admitted on involuntary basis. On admission, she spoke about depression, anxiety and recent suicidal thoughts. She spoke about how she is feeling ok on admission and denies any current SI and is able to contract for safety while in the hospital. She reports that she has a history of cutting and does have some fresh cuts to leg that were self-inflicted. She spoke about how she is having family and relationship issues along with school stressors. She reports that she is not on any medications currently but reports she was on abilify about a year ago by a doctor at her school but reports that she stopped it feeling it did not help her. She denies any substance abuse issues and reports that once she is discharged that she will probably go live with her parents. Diamond West was oriented to the unit and safety maintained.

## 2017-09-26 NOTE — Plan of Care (Signed)
  Progressing Safety: Periods of time without injury will increase 09/26/2017 2352 - Progressing by Arrie Aranhurch, Ramsie Ostrander J, RN Note Pt has not harmed self or others tonight.  She denies SI/HI and verbally contracts for safety.

## 2017-09-26 NOTE — [facility] Group Notes (Signed)
   Date:  09/26/2017  Time:  3:59 PM  Type of Therapy:  Nurse Education  /  The group focuses on teaching patients how to identify their primary emotion behind their anger and then how to devlop the skills needed to manage their anger.  Participation Level:  Active  Participation Quality:  Attentive  Affect:  Appropriate  Cognitive:  Alert  Insight:  Improving  Engagement in Group:  Engaged  Modes of Intervention:  Education  Summary of Progress/Problems:  Rich BraveDuke, Chaise Passarella Lynn 09/26/2017, 3:59 PM

## 2017-09-26 NOTE — [facility] Suicide Risk Assessment (Signed)
[facility]   Nursing information obtained from:   patient and chart  Demographic factors:   21 year old single female, college student Current Mental Status:   see below Loss Factors:   academic, relationship stressors  Historical Factors:   history of depression, anxiety, particularly over the last several months  Risk Reduction Factors:   resilience, family support   Total Time spent with patient: 45 minutes Principal Problem:  MDD , consider GAD Diagnosis:   Patient Active Problem List   Diagnosis Date Noted  . Major depressive disorder, single episode, severe without psychotic features ([facility]) [F32.2] 09/25/2017  . Severe recurrent major depression without psychotic features ([facility]) [F33.2] 09/25/2017     Continued Clinical Symptoms:  Alcohol Use Disorder Identification Test Final Score (AUDIT): 1 The "Alcohol Use Disorders Identification Test", Guidelines for Use in Primary Care, Second Edition.  World Science writerHealth Organization [facility](WHO). Score between 0-7:  no or low risk or alcohol related problems. Score between 8-15:  moderate risk of alcohol related problems. Score between 16-19:  high risk of alcohol related problems. Score 20 or above:  warrants further diagnostic evaluation for alcohol dependence and treatment.   CLINICAL FACTORS:  21 year old female, college student, reports she has been feeling increasingly depressed, anxious, and endorses some neuro-vegetative symptoms ( particularly poor appetite, weight loss) and passive SI. Recent episode of self inflicted cut, states did not require sutures. Reports feeling anxious and overwhelmed by stressors at times , mainly academic and college housing/roomate issues . Admitted under IVC generated by father.   Psychiatric Specialty Exam: Physical Exam  ROS  Blood pressure 102/66, pulse (!) 101, temperature 98.6 F (37 C), temperature source Oral, resp. rate 16, height 5\' 2"  (1.575 m), weight 49.9 kg (110  lb).Body mass index is 20.12 kg/m.   see admit note MSE    COGNITIVE FEATURES THAT CONTRIBUTE TO RISK:  Closed-mindedness and Loss of executive function    SUICIDE RISK:   Moderate:  Frequent suicidal ideation with limited intensity, and duration, some specificity in terms of plans, no associated intent, good self-control, limited dysphoria/symptomatology, some risk factors present, and identifiable protective factors, including available and accessible social support.  PLAN OF CARE: Patient will be admitted to inpatient psychiatric unit for stabilization and safety. Will provide and encourage milieu participation. Provide medication management and maked adjustments as needed.  Will follow daily.    I certify that inpatient services furnished can reasonably be expected to improve the patient's condition.   Craige CottaFernando A Katy Brickell, MD 09/26/2017, 4:37 PM

## 2017-09-26 NOTE — Progress Notes (Signed)
D: Pt was in the dayroom talking to peers upon initial approach.  Pt presents with appropriate affect and mood.  She describes her day as "good."  Her goal was to "socialize, get to know people here and I'm doing that."  Pt smiles as she is talking to Clinical research associatewriter.  Pt denies SI/HI, denies hallucinations, denies pain.  Pt has been visible in milieu interacting with peers and staff appropriately.  Pt attended evening group.    A: Introduced self to pt.  Actively listened to pt and offered support and encouragement. Q15 minute safety checks maintained.  R: Pt is safe on the unit.  Pt verbally contracts for safety.  Will continue to monitor and assess.

## 2017-09-26 NOTE — Progress Notes (Signed)
Patient ID: [name], female   DOB: 05-28-1996, 21 y.o.   MRN: [id number]  D: Patient was in dayroom obtaining vital signs and suddenly felt faint. Staff had to prevent her from falling. Patient looked pail on approach. Staff helped in bathroom. Patient starting feeling better. Given gatorade. Blood pressure went back up to 101/68 pulse- 106. Unknown if trazodone caused some of this morning drowsiness.  A: Staff will monitor frequently R: Reports better after drinking some gatorade and blood pressure retaken.

## 2017-09-26 NOTE — [facility] Group Notes (Signed)
[facility] LCSW Group Therapy Note  Date/Time:  09/26/2017  10:00-11:00AM  Type of Therapy and Topic:  Group Therapy:  Music and Mood  Participation Level:  Minimal   Description of Group: In this process group, members listened to a variety of genres of music and identified that different types of music evoke different responses.  Patients were encouraged to identify music that was soothing for them and music that was energizing for them.  Patients discussed how this knowledge can help with wellness and recovery in various ways including managing depression and anxiety as well as encouraging healthy sleep habits.    Therapeutic Goals: 1. Patients will explore the impact of different varieties of music on mood 2. Patients will verbalize the thoughts they have when listening to different types of music 3. Patients will identify music that is soothing to them as well as music that is energizing to them 4. Patients will discuss how to use this knowledge to assist in maintaining wellness and recovery 5. Patients will explore the use of music as a coping skill  Summary of Patient Progress:  Patient did not arrive to group until the last 5 minutes or so.  She smiled and said this was from the music because it made her feel good.  Therapeutic Modalities: Solution Focused Brief Therapy Motivational Interviewing Activity   Ambrose MantleMareida Grossman-Orr, LCSW 09/26/2017 11:28 AM

## 2017-09-27 DIAGNOSIS — F322 Major depressive disorder, single episode, severe without psychotic features: Secondary | ICD-10-CM

## 2017-09-27 DIAGNOSIS — F39 Unspecified mood [affective] disorder: Secondary | ICD-10-CM

## 2017-09-27 LAB — LIPID PANEL
Cholesterol: 249 mg/dL — ABNORMAL HIGH (ref 0–200)
HDL: 59 mg/dL (ref >40)
LDL CALC: 165 mg/dL — AB (ref 0–99)
TRIGLYCERIDES: 126 mg/dL (ref <150)
Total CHOL/HDL Ratio: 4.2 RATIO
VLDL: 25 mg/dL (ref 0–40)

## 2017-09-27 LAB — TSH: TSH: 1.977 u[IU]/mL (ref 0.350–4.500)

## 2017-09-27 NOTE — [facility] Group Notes (Signed)
[facility] LCSW Group Therapy Note  Date/Time: 09/27/17, 1315  Type of Therapy and Topic:  Group Therapy:  Overcoming Obstacles  Participation Level:  active  Description of Group:    In this group patients will be encouraged to explore what they see as obstacles to their own wellness and recovery. They will be guided to discuss their thoughts, feelings, and behaviors related to these obstacles. The group will process together ways to cope with barriers, with attention given to specific choices patients can make. Each patient will be challenged to identify changes they are motivated to make in order to overcome their obstacles. This group will be process-oriented, with patients participating in exploration of their own experiences as well as giving and receiving support and challenge from other group members.  Therapeutic Goals: 1. Patient will identify personal and current obstacles as they relate to admission. 2. Patient will identify barriers that currently interfere with their wellness or overcoming obstacles.  3. Patient will identify feelings, thought process and behaviors related to these barriers. 4. Patient will identify two changes they are willing to make to overcome these obstacles:    Summary of Patient Progress: Pt identified depression, anxiety, school, work and feeling stressed as obstacles in her life.  Pt participated in group discussion regarding taking care of self in the midst of a lot of responsibility and discussed coping skills to help manage stressful situations.        Therapeutic Modalities:   Cognitive Behavioral Therapy Solution Focused Therapy Motivational Interviewing Relapse Prevention Therapy  Daleen SquibbGreg Saveon [place], LCSW
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
## 2017-09-27 NOTE — Tx Team (Signed)
Interdisciplinary Treatment and Diagnostic Plan Update  09/27/2017 Time of Session: 1236 Diamond CutterVictoria B Kussman MRN: [id number]  Principal Diagnosis: <principal problem not specified>  Secondary Diagnoses: Active Problems:   Severe recurrent major depression without psychotic features (HCC)   Current Medications:  Current Facility-Administered Medications  Medication Dose Route Frequency Provider Last Rate Last Dose  . alum & mag hydroxide-simeth (MAALOX/MYLANTA) 200-200-20 MG/5ML suspension 30 mL  30 mL Oral Q4H PRN Nira ConnBerry, Jason A, NP      . citalopram (CELEXA) tablet 10 mg  10 mg Oral Daily Cobos, Rockey SituFernando A, MD   10 mg at 09/27/17 [id number]  . hydrOXYzine (ATARAX/VISTARIL) tablet 25 mg  25 mg Oral TID PRN Jackelyn PolingBerry, Jason A, NP      . ibuprofen (ADVIL,MOTRIN) tablet 400 mg  400 mg Oral Q6H PRN Nira ConnBerry, Jason A, NP   400 mg at 09/25/17 2228  . magnesium hydroxide (MILK OF MAGNESIA) suspension 30 mL  30 mL Oral Daily PRN Nira ConnBerry, Jason A, NP      . traZODone (DESYREL) tablet 50 mg  50 mg Oral QHS PRN Jackelyn PolingBerry, Jason A, NP   50 mg at 09/25/17 2229   PTA Medications: Medications Prior to Admission  Medication Sig Dispense Refill Last Dose  . ibuprofen (ADVIL,MOTRIN) 200 MG tablet Take 400 mg by mouth every 6 (six) hours as needed for moderate pain.   Past Month at Unknown time  . JUNEL FE 1/20 1-20 MG-MCG tablet Take 1 tablet by mouth daily.  2 09/24/2017 at Unknown time    Patient Stressors: Educational concerns Marital or family conflict  Patient Strengths: Ability for insight Average or above average intelligence Capable of independent living [employer] of knowledge Motivation for treatment/growth Supportive family/friends  Treatment Modalities: Medication Management, Group therapy, Case management,  1 to 1 session with clinician, Psychoeducation, Recreational therapy.   Physician Treatment Plan for Primary Diagnosis: <principal problem not specified> Long Term Goal(s): Improvement in  symptoms so as ready for discharge Improvement in symptoms so as ready for discharge   Short Term Goals: Ability to identify changes in lifestyle to reduce recurrence of condition will improve Ability to maintain clinical measurements within normal limits will improve Ability to verbalize feelings will improve Ability to disclose and discuss suicidal ideas Ability to demonstrate self-control will improve Ability to identify and develop effective coping behaviors will improve  Medication Management: Evaluate patient's response, side effects, and tolerance of medication regimen.  Therapeutic Interventions: 1 to 1 sessions, Unit Group sessions and Medication administration.  Evaluation of Outcomes: Progressing  Physician Treatment Plan for Secondary Diagnosis: Active Problems:   Severe recurrent major depression without psychotic features (HCC)  Long Term Goal(s): Improvement in symptoms so as ready for discharge Improvement in symptoms so as ready for discharge   Short Term Goals: Ability to identify changes in lifestyle to reduce recurrence of condition will improve Ability to maintain clinical measurements within normal limits will improve Ability to verbalize feelings will improve Ability to disclose and discuss suicidal ideas Ability to demonstrate self-control will improve Ability to identify and develop effective coping behaviors will improve     Medication Management: Evaluate patient's response, side effects, and tolerance of medication regimen.  Therapeutic Interventions: 1 to 1 sessions, Unit Group sessions and Medication administration.  Evaluation of Outcomes: Progressing   RN Treatment Plan for Primary Diagnosis: <principal problem not specified> Long Term Goal(s): Knowledge of disease and therapeutic regimen to maintain health will improve  Short Term Goals: Ability to identify and  develop effective coping behaviors will improve and Compliance with prescribed  medications will improve  Medication Management: RN will administer medications as ordered by provider, will assess and evaluate patient's response and provide education to patient for prescribed medication. RN will report any adverse and/or side effects to prescribing provider.  Therapeutic Interventions: 1 on 1 counseling sessions, Psychoeducation, Medication administration, Evaluate responses to treatment, Monitor vital signs and CBGs as ordered, Perform/monitor CIWA, COWS, AIMS and Fall Risk screenings as ordered, Perform wound care treatments as ordered.  Evaluation of Outcomes: Progressing   LCSW Treatment Plan for Primary Diagnosis: <principal problem not specified> Long Term Goal(s): Safe transition to appropriate next level of care at discharge, Engage patient in therapeutic group addressing interpersonal concerns.  Short Term Goals: Engage patient in aftercare planning with referrals and resources, Increase social support and Increase skills for wellness and recovery  Therapeutic Interventions: Assess for all discharge needs, 1 to 1 time with Social worker, Explore available resources and support systems, Assess for adequacy in community support network, Educate family and significant other(s) on suicide prevention, Complete Psychosocial Assessment, Interpersonal group therapy.  Evaluation of Outcomes: Progressing   Progress in Treatment: Attending groups: Yes. Participating in groups: Yes. Taking medication as prescribed: Yes. Toleration medication: Yes. Family/Significant other contact made: No, will contact:  when given permission Patient understands diagnosis: Yes. Discussing patient identified problems/goals with staff: Yes. Medical problems stabilized or resolved: Yes. Denies suicidal/homicidal ideation: Yes. Issues/concerns per patient self-inventory: No. Other: none  New problem(s) identified: No, Describe:  none  New Short Term/Long Term Goal(s):  Discharge Plan  or Barriers:   Reason for Continuation of Hospitalization: Depression Medication stabilization  Estimated Length of Stay: 3-5 days.  Attendees: Patient: 09/27/2017   Physician: Dr Jama Flavorsobos, MD 09/27/2017   Nursing: Joslyn Devonaroline Beaudry, RN 09/27/2017   RN Care Manager: 09/27/2017   Social Worker: Daleen SquibbGreg Park Beck, LCSW 09/27/2017   Recreational Therapist:  09/27/2017   Other:  09/27/2017   Other:  09/27/2017   Other: 09/27/2017        Scribe for Treatment Team: Lorri FrederickWierda, Audreyana Huntsberry Jon, LCSW 09/27/2017 12:36 PM

## 2017-09-27 NOTE — BHH Group Notes (Signed)
Adult Psychoeducational Group Note  Date:  09/27/2017 Time:  8:51 PM  Group Topic/Focus:  Wrap-Up Group:   The focus of this group is to help patients review their daily goal of treatment and discuss progress on daily workbooks.  Participation Level:  Active  Participation Quality:  Appropriate and Attentive  Affect:  Appropriate   Cognitive:  Alert and Appropriate  Insight: Appropriate and Good  Engagement in Group:  Engaged  Modes of Intervention:  Discussion  Additional Comments: Pt stated her day was West 9 because her visit with her dad was West bit stressful but after he left she practiced some deep breathing which helped. No SI/HI.    Diamond West, Diamond West 09/27/2017, 8:51 PM

## 2017-09-27 NOTE — Progress Notes (Signed)
D: Patient is pleasant and smiling today.  She states, "I feel much better."  Patient is sleeping and eating well; her energy level is normal and concentration is good.  She denies any depressive symptoms or hopelessness.  She does not appear to be in any distress physically or mentally.  She denies any thoughts of self harm.  Her goal today is to "try to figure out how to get better."  Patient has been attending group and appears engaged.  A: Continue to monitor medication management and MD orders.  Safety checks completed every 15 minutes per protocol.  Offer support and encouragement as needed.  R: Patient is receptive to staff; her behavior is appropriate.

## 2017-09-27 NOTE — Plan of Care (Signed)
  Progressing Activity: Sleeping patterns will improve 09/27/2017 2241 - Progressing by Arrie Aranhurch, Kutler Vanvranken J, RN Note Slept 6 hours last night according to flowsheet.

## 2017-09-27 NOTE — Progress Notes (Signed)
D: Pt was in the dayroom upon initial approach.  Pt presents with appropriate affect and anxious mood.  She describes her day as "good" and reports her goal was "to wake up this morning and not feel like I did yesterday morning."  Pt reports she felt "very weak and very dizzy" yesterday morning but not this morning.  Pt denies SI/HI, denies hallucinations, denies pain.  Pt has been visible in milieu interacting with peers and staff appropriately.  Pt attended evening group.    A: Introduced self to pt.  Actively listened to pt and offered support and encouragement. PRN medication administered for anxiety per request.  Q15 minute safety checks maintained.  R: Pt is safe on the unit.  Pt is compliant with medication.  Pt verbally contracts for safety.  Will continue to monitor and assess.

## 2017-09-27 NOTE — Progress Notes (Signed)
Recreation Therapy Notes  Date: 09/27/17 Time: 0930 Location: 300 Hall Dayroom  Group Topic: Stress Management  Goal Area(s) Addresses:  Patient will verbalize importance of using healthy stress management.  Patient will identify positive emotions associated with healthy stress management.   Intervention: Stress Management  Activity :  Meditation.  LRT introduced the stress management technique of meditation.  LRT played a meditation that allowed patients to take inventory of the sensations they may be feeling throughout their bodies.  Education:  Stress Management, Discharge Planning.   Education Outcome: Acknowledges edcuation/In group clarification offered/Needs additional education  Clinical Observations/Feedback: Pt did not attend group.    Chistina Roston, LRT/CTRS         Andrey Mccaskill A 09/27/2017 11:41 AM 

## 2017-09-27 NOTE — Progress Notes (Signed)
[facility]  09/27/2017 12:37 PM Luciano CutterVictoria B Franey  MRN:  [id number]   Subjective:  Patient reports that she is doing very good today. She denies any SI/HI/AVH or medication side effects. She contracts for safety. She reports that she slept fair and thinks it is due to being in the hospital. She has plans to stay with her parents after discharge and go back to college after Christmas for her Criminal Justice degree.  Objective: Patient's chart and findings reviewed and discussed with treatment team. Patient presents very pleasant and cooperative. She has been in the day room interacting appropriately. She has been attending meetings.   Principal Problem: Severe recurrent major depression without psychotic features (HCC) Diagnosis:   Patient Active Problem List   Diagnosis Date Noted  . Major depressive disorder, single episode, severe without psychotic features (HCC) [F32.2] 09/25/2017  . Severe recurrent major depression without psychotic features (HCC) [F33.2] 09/25/2017   Total Time spent with patient: 15 minutes  Past Psychiatric History: See H&P  Past Medical History: History reviewed. No pertinent past medical history. History reviewed. No pertinent surgical history. Family History: History reviewed. No pertinent family history. Family Psychiatric  History: See H&P Social History:  Social History   Substance and Sexual Activity  Alcohol Use No     Social History   Substance and Sexual Activity  Drug Use No    Social History   Socioeconomic History  . Marital status: Single    Spouse name: None  . Number of children: None  . Years of education: None  . Highest education level: None  Social Needs  . Financial resource strain: None  . Food insecurity - worry: None  . Food insecurity - inability: None  . Transportation needs - medical: None  . Transportation needs - non-medical: None  Occupational History  . None  Tobacco Use  . Smoking status: Never  Smoker  . Smokeless tobacco: Never Used  Substance and Sexual Activity  . Alcohol use: No  . Drug use: No  . Sexual activity: None  Other Topics Concern  . None  Social History Narrative  . None   Additional Social History:                         Sleep: Fair  Appetite:  Good  Current Medications: Current Facility-Administered Medications  Medication Dose Route Frequency Provider Last Rate Last Dose  . alum & mag hydroxide-simeth (MAALOX/MYLANTA) 200-200-20 MG/5ML suspension 30 mL  30 mL Oral Q4H PRN Nira ConnBerry, Jason A, NP      . citalopram (CELEXA) tablet 10 mg  10 mg Oral Daily Cobos, Rockey SituFernando A, MD   10 mg at 09/27/17 [id number]  . hydrOXYzine (ATARAX/VISTARIL) tablet 25 mg  25 mg Oral TID PRN Jackelyn PolingBerry, Jason A, NP      . ibuprofen (ADVIL,MOTRIN) tablet 400 mg  400 mg Oral Q6H PRN Nira ConnBerry, Jason A, NP   400 mg at 09/25/17 2228  . magnesium hydroxide (MILK OF MAGNESIA) suspension 30 mL  30 mL Oral Daily PRN Nira ConnBerry, Jason A, NP      . traZODone (DESYREL) tablet 50 mg  50 mg Oral QHS PRN Jackelyn PolingBerry, Jason A, NP   50 mg at 09/25/17 2229    Lab Results:  Results for orders placed or performed during the hospital encounter of 09/25/17 (from the past 48 hour(s))  Lipid panel     Status: Abnormal   Collection Time: 09/27/17  6:14  AM  Result Value Ref Range   Cholesterol 249 (H) 0 - 200 mg/dL   Triglycerides 147 <829 mg/dL   HDL 59 >56 mg/dL   Total CHOL/HDL Ratio 4.2 RATIO   VLDL 25 0 - 40 mg/dL   LDL Cholesterol 213 (H) 0 - 99 mg/dL    Comment:        Total Cholesterol/HDL:CHD Risk Coronary Heart Disease Risk Table                     Men   Women  1/2 Average Risk   3.4   3.3  Average Risk       5.0   4.4  2 X Average Risk   9.6   7.1  3 X Average Risk  23.4   11.0        Use the calculated Patient Ratio above and the CHD Risk Table to determine the patient's CHD Risk.        ATP III CLASSIFICATION (LDL):  <100     mg/dL   Optimal  086-578  mg/dL   Near or Above                     Optimal  130-159  mg/dL   Borderline  469-629  mg/dL   High  >528     mg/dL   Very High Performed at [facility], [address] [place], [place] [address]   TSH     Status: None   Collection Time: 09/27/17  6:14 AM  Result Value Ref Range   TSH 1.977 0.350 - 4.500 uIU/mL    Comment: Performed by a 3rd Generation assay with a functional sensitivity of <=0.01 uIU/mL. Performed at [facility], [address], [place], [place] [address]     Blood Alcohol level:  Lab Results  Component Value Date   ETH <10 09/25/2017    Metabolic Disorder Labs: No results found for: HGBA1C, MPG No results found for: PROLACTIN Lab Results  Component Value Date   CHOL 249 (H) 09/27/2017   TRIG 126 09/27/2017   HDL 59 09/27/2017   CHOLHDL 4.2 09/27/2017   VLDL 25 09/27/2017   LDLCALC 165 (H) 09/27/2017    Physical Findings: AIMS: Facial and Oral Movements Muscles of Facial Expression: None, normal Lips and Perioral Area: None, normal Jaw: None, normal Tongue: None, normal,Extremity Movements Upper (arms, wrists, hands, fingers): None, normal Lower (legs, knees, ankles, toes): None, normal, Trunk Movements Neck, shoulders, hips: None, normal, Overall Severity Severity of abnormal movements (highest score from questions above): None, normal Incapacitation due to abnormal movements: None, normal Patient's awareness of abnormal movements (rate only patient's report): No Awareness, Dental Status Current problems with teeth and/or dentures?: No Does patient usually wear dentures?: No  CIWA:    COWS:     Musculoskeletal: Strength & Muscle Tone: within normal limits Gait & Station: normal Patient leans: N/A  Psychiatric Specialty Exam: Physical Exam  Nursing note and vitals reviewed. Constitutional: She is oriented to person, place, and time. She appears well-developed and well-nourished.  Cardiovascular: Normal rate.  Respiratory: Effort normal.   Musculoskeletal: Normal range of motion.  Neurological: She is alert and oriented to person, place, and time.  Skin: Skin is warm.    Review of Systems  Constitutional: Negative.   HENT: Negative.   Eyes: Negative.   Respiratory: Negative.   Cardiovascular: Negative.   Gastrointestinal: Negative.   Genitourinary: Negative.  Musculoskeletal: Negative.   Skin: Negative.   Neurological: Negative.   Endo/Heme/Allergies: Negative.   Psychiatric/Behavioral: Negative.     Blood pressure 111/63, pulse 78, temperature 98.3 F (36.8 C), temperature source Oral, resp. rate 16, height 5\' 2"  (1.575 m), weight 49.9 kg (110 lb).Body mass index is 20.12 kg/m.  General Appearance: Casual  Eye Contact:  Good  Speech:  Clear and Coherent and Normal Rate  Volume:  Normal  Mood:  Euthymic  Affect:  Appropriate  Thought Process:  Goal Directed and Descriptions of Associations: Intact  Orientation:  NA  Thought Content:  WDL  Suicidal Thoughts:  No  Homicidal Thoughts:  No  Memory:  Immediate;   Good Recent;   Good Remote;   Good  Judgement:  Good  Insight:  Good  Psychomotor Activity:  Normal  Concentration:  Concentration: Good and Attention Span: Good  Recall:  Good  Fund of Knowledge:  Good  Language:  Good  Akathisia:  No  Handed:  Right  AIMS (if indicated):     Assets:  Communication Skills Desire for Improvement Financial Resources/Insurance Housing Physical Health Social Support Transportation Vocational/Educational  ADL's:  Intact  Cognition:  WNL  Sleep:  Number of Hours: 6   Problems Addressed: MDD severe  Treatment Plan Summary: Daily contact with patient to assess and evaluate symptoms and progress in treatment, Medication management and Plan is to:  -Continue Celexa 10 mg PO Daily for mood stability -Continue Vistaril 25 mg PO TID PRN for anxiety -Continue Trazodone 50 mg PO QHS PRN for anxiety -Encourage group therapy participation   Maryfrances Bunnellravis B Money,  FNP 09/27/2017, 12:37 PM   Agree with NP Progress Note

## 2017-09-28 LAB — HEMOGLOBIN A1C
Hgb A1c MFr Bld: 5.2 % (ref 4.8–5.6)
MEAN PLASMA GLUCOSE: 103 mg/dL

## 2017-09-28 NOTE — [facility] Suicide Risk Assessment (Signed)
[facility] INPATIENT:  Family/Significant Other Suicide Prevention Education  Suicide Prevention Education:  Education Completed; Gustavo Lahric Hofferber, father, [phone], has been identified by the patient as the family member/significant other with whom the patient will be residing, and identified as the person(s) who will aid the patient in the event of a mental health crisis (suicidal ideations/suicide attempt).  With written consent from the patient, the family member/significant other has been provided the following suicide prevention education, prior to the and/or following the discharge of the patient.  The suicide prevention education provided includes the following:  Suicide risk factors  Suicide prevention and interventions  National Suicide Hotline telephone number  [facility] assessment telephone number  [facility] Emergency Assistance 911  [facility] and/or Residential Mobile Crisis Unit telephone number  Request made of family/significant other to:  Remove weapons (e.g., guns, rifles, knives), all items previously/currently identified as safety concern.  Father has guns in his home, locked, but is not aware of other guns that pt would have access to.  Remove drugs/medications (over-the-counter, prescriptions, illicit drugs), all items previously/currently identified as a safety concern.  The family member/significant other verbalizes understanding of the suicide prevention education information provided.  The family member/significant other agrees to remove the items of safety concern listed above.  Father reports pt has lots of stress from school and finances.  Pt upgraded her dorm room and [facility],000 [facility], which father is making her pay through work.  Pt also has lots of boyfriend drama: frequent breakups.  More significantly, pt's mother has not been involved since pt was young due to drug use.  Mother recently made contact and pt has been seeing her  and father is not willing to allow pt to be in his home when pt's mother is in the picture due to lots of past problems.  CSW asked why he pursued IVC?  Father reports [facility] Thursday night.  For the next two days, pt stayed in bed, would not eat, continued to say she was suicidal, and he felt like he needed to take out IVC when things did not improve over several days.  [place] and is very familiar with mental health system.      Lorri FrederickWierda, Damaree Sargent Jon, LCSW 09/28/2017, 3:20 PM

## 2017-09-28 NOTE — Progress Notes (Signed)
Recreation Therapy Notes  Animal-Assisted Activity (AAA) Program Checklist/Progress Notes Patient Eligibility Criteria Checklist & Daily Group note for Rec TxIntervention  Date: 12.18.2018 Time: 2:45pm [address]   AAA/T Program Assumption of Risk Form signed by Patient/ or Parent Legal Guardian Yes  Patient is free of allergies or sever asthma Yes  Patient reports no fear of animals Yes  Patient reports no history of cruelty to animals Yes  Patient understands his/her participation is voluntary Yes  Patient washes hands before animal contact Yes  Patient washes hands after animal contact Yes  Behavioral Response: Appropriate   Education:Hand Washing, Appropriate Animal Interaction   Education Outcome: Acknowledges education.   Clinical Observations/Feedback: Patient attended session and interacted appropriately with therapy dog and peers.   Marykay Lexenise L Britten Parady, LRT/CTRS        Herma Uballe L 09/28/2017 3:14 PM

## 2017-09-28 NOTE — Plan of Care (Signed)
  Progressing Activity: Interest or engagement in activities will improve 09/28/2017 0730 - Progressing by Angela AdamBeaudry, Ardith Lewman E, RN Sleeping patterns will improve 09/28/2017 0730 - Progressing by Angela AdamBeaudry, Salih Williamson E, RN Education: Knowledge of [place] [facility] Education information/materials will improve 09/28/2017 0730 - Progressing by Angela AdamBeaudry, Anothy Bufano E, RN Emotional status will improve 09/28/2017 0730 - Progressing by Angela AdamBeaudry, Lennette Fader E, RN Mental status will improve 09/28/2017 0730 - Progressing by Angela AdamBeaudry, Aljean Horiuchi E, RN Verbalization of understanding the information provided will improve 09/28/2017 0730 - Progressing by Angela AdamBeaudry, Gracyn Allor E, RN Coping: Ability to verbalize frustrations and anger appropriately will improve 09/28/2017 0730 - Progressing by Angela AdamBeaudry, Theo Reither E, RN Ability to demonstrate self-control will improve 09/28/2017 0730 - Progressing by Angela AdamBeaudry, Tadd Holtmeyer E, RN

## 2017-09-28 NOTE — Progress Notes (Signed)
D: Patient is pleasant and smiling.  She denies any depressive symptoms or thoughts of self harm.  She is sleeping and eating well; her energy level is normal and her concentration is good.  Her goal today is to "focus on getting better to get out."  She is attending group and appears engaged.  Patient is hoping to discharge soon.  A: Continue to monitor medication management and MD orders.  Safety checks completed every 15 minutes per protocol.  Offer support and encouragement as needed.  R: Patient is receptive to staff; her behavior is appropriate.

## 2017-09-28 NOTE — Progress Notes (Signed)
[facility] MD Progress Note  09/28/2017 10:49 AM Diamond West  MRN:  [id number] Subjective:   21 y.o Caucasian female,  college student at [employer] No past history of mental illness. Involuntarily committed by her father. Reported to have been very depressed recently. She sent a text to her best friend and ex- boyfriend expressing suicidal thoughts. She has been making statements about hopelessness and worthlessness to her father and step mom. She has not been eating well. She has lost a lot of weight. She has been very stressed lately. School demands have been very challenging for her. She has a history of cutting. She cut self recently.  Routine labs are within normal limits. Toxicology is negative. No substance use.   Chart reviewed today. Patient discussed at team today. Reported to be doing well. No collateral from her parents yet.   Staff reports that she is making good progress. She has improved with medication. She is tolerating her medication well.   Seen today. Patient was at a group session just prior to interview. Tells me that she is feeling much better since being started on medication. Says her anxiety and depression are no zero. Says she is eating well again. She is sleeping well. Says she is no longer irritated with people. Says she has not had any suicidal thoughts lately. 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 Says she has been taking more major classes so she can graduate. Say she is on scholarship. She does not want to lose it. Says she had been under a lot of pressure. Says she moved in with a new set of roommates a couple of months ago. The house is tense one of them has taken another one to court. Patient states that treating her depressing would help her cope better. Says retrospectively, she has observed that she was pushing friends and family away. Says she had also pushed her faith away. Patient tells me that her father visited yesterday. Says the last time they met, it was tense. Says it  spilt over into his visit. Patient says she called him later to apologize. Says she feels better afterwards. Patient reports no past history of mania. Says she was recommended Abilify in the past for depression. Says she stopped it after a week as it was making her anxious. She reports history of cutting self in the out side of her leg. Says she knows her anatomy and knows she would not bleed much there. Says she cuts to ease emotional tension. Patient says she has never attempted suicide via any other means.No lingering suicidal thoughts.  No evidence of psychosis. No evidence of mania. No thoughts of violence. No homicidal thoughts.   Principal Problem: Severe recurrent major depression without psychotic features ([place]) Diagnosis:   Patient Active Problem List   Diagnosis Date Noted  . Major depressive disorder, single episode, severe without psychotic features ([place]) [F32.2] 09/25/2017  . Severe recurrent major depression without psychotic features ([place]) [F33.2] 09/25/2017   Total Time spent with patient: 20 minutes  Past Psychiatric History: As in H&P  Past Medical History: History reviewed. No pertinent past medical history. History reviewed. No pertinent surgical history. Family History: History reviewed. No pertinent family history. Family Psychiatric  History: As in H&P Social History:  Social History   Substance and Sexual Activity  Alcohol Use No     Social History   Substance and Sexual Activity  Drug Use No    Social History   Socioeconomic History  . Marital status:  Single    Spouse name: None  . Number of children: None  . Years of education: None  . Highest education level: None  Social Needs  . Financial resource strain: None  . Food insecurity - worry: None  . Food insecurity - inability: None  . Transportation needs - medical: None  . Transportation needs - non-medical: None  Occupational History  . None  Tobacco Use  . Smoking status: Never Smoker  .  Smokeless tobacco: Never Used  Substance and Sexual Activity  . Alcohol use: No  . Drug use: No  . Sexual activity: None  Other Topics Concern  . None  Social History Narrative  . None   Additional Social History:          Sleep: Good  Appetite:  Good  Current Medications: Current Facility-Administered Medications  Medication Dose Route Frequency Provider Last Rate Last Dose  . alum & mag hydroxide-simeth (MAALOX/MYLANTA) 200-200-20 MG/5ML suspension 30 mL  30 mL Oral Q4H PRN Lindon Romp A, NP      . citalopram (CELEXA) tablet 10 mg  10 mg Oral Daily Cobos, Myer Peer, MD   10 mg at 09/28/17 0805  . hydrOXYzine (ATARAX/VISTARIL) tablet 25 mg  25 mg Oral TID PRN Rozetta Nunnery, NP   25 mg at 09/27/17 2120  . ibuprofen (ADVIL,MOTRIN) tablet 400 mg  400 mg Oral Q6H PRN Lindon Romp A, NP   400 mg at 09/27/17 1418  . magnesium hydroxide (MILK OF MAGNESIA) suspension 30 mL  30 mL Oral Daily PRN Rozetta Nunnery, NP      . traZODone (DESYREL) tablet 50 mg  50 mg Oral QHS PRN Rozetta Nunnery, NP   50 mg at 09/25/17 2229    Lab Results:  Results for orders placed or performed during the hospital encounter of 09/25/17 (from the past 48 hour(s))  Hemoglobin A1c     Status: None   Collection Time: 09/27/17  6:14 AM  Result Value Ref Range   Hgb A1c MFr Bld 5.2 4.8 - 5.6 %    Comment: (NOTE)         Prediabetes: 5.7 - 6.4         Diabetes: >6.4         Glycemic control for adults with diabetes: <7.0    Mean Plasma Glucose 103 mg/dL    Comment: (NOTE) Performed At: [facility] [place], [place] [id number] Rush Farmer MD XO:[id number] Performed at [facility], [place] [address], [place], [place] [address]   Lipid panel     Status: Abnormal   Collection Time: 09/27/17  6:14 AM  Result Value Ref Range   Cholesterol 249 (H) 0 - 200 mg/dL   Triglycerides 126 <150 mg/dL   HDL 59 >40 mg/dL   Total CHOL/HDL Ratio 4.2 RATIO   VLDL 25 0 - 40  mg/dL   LDL Cholesterol 165 (H) 0 - 99 mg/dL    Comment:        Total Cholesterol/HDL:CHD Risk Coronary Heart Disease Risk Table                     Men   Women  1/2 Average Risk   3.4   3.3  Average Risk       5.0   4.4  2 X Average Risk   9.6   7.1  3 X Average Risk  23.4   11.0  Use the calculated Patient Ratio above and the CHD Risk Table to determine the patient's CHD Risk.        ATP III CLASSIFICATION (LDL):  <100     mg/dL   Optimal  100-129  mg/dL   Near or Above                    Optimal  130-159  mg/dL   Borderline  160-189  mg/dL   High  >190     mg/dL   Very High Performed at [facility] [place] [address], [place], [place] [address]   TSH     Status: None   Collection Time: 09/27/17  6:14 AM  Result Value Ref Range   TSH 1.977 0.350 - 4.500 uIU/mL    Comment: Performed by a 3rd Generation assay with a functional sensitivity of <=0.01 uIU/mL. Performed at [facility], [place] [address], [place], [place] [address]     Blood Alcohol level:  Lab Results  Component Value Date   ETH <10 32/35/5732    Metabolic Disorder Labs: Lab Results  Component Value Date   HGBA1C 5.2 09/27/2017   MPG 103 09/27/2017   No results found for: PROLACTIN Lab Results  Component Value Date   CHOL 249 (H) 09/27/2017   TRIG 126 09/27/2017   HDL 59 09/27/2017   CHOLHDL 4.2 09/27/2017   VLDL 25 09/27/2017   LDLCALC 165 (H) 09/27/2017    Physical Findings: AIMS: Facial and Oral Movements Muscles of Facial Expression: None, normal Lips and Perioral Area: None, normal Jaw: None, normal Tongue: None, normal,Extremity Movements Upper (arms, wrists, hands, fingers): None, normal Lower (legs, knees, ankles, toes): None, normal, Trunk Movements Neck, shoulders, hips: None, normal, Overall Severity Severity of abnormal movements (highest score from questions above): None, normal Incapacitation due to abnormal movements: None, normal Patient's  awareness of abnormal movements (rate only patient's report): No Awareness, Dental Status Current problems with teeth and/or dentures?: No Does patient usually wear dentures?: No  CIWA:    COWS:     Musculoskeletal: Strength & Muscle Tone: within normal limits Gait & Station: normal Patient leans: N/A  Psychiatric Specialty Exam: Physical Exam  Constitutional: She is oriented to person, place, and time. She appears well-developed and well-nourished.  HENT:  Head: Normocephalic and atraumatic.  Respiratory: Effort normal.  Neurological: She is alert and oriented to person, place, and time.  Psychiatric:  As above    ROS  Blood pressure 105/62, pulse 69, temperature 98.1 F (36.7 C), temperature source Oral, resp. rate 18, height 5' 2"  (1.575 m), weight 49.9 kg (110 lb).Body mass index is 20.12 kg/m.  General Appearance: Well groomed, good relatedness. Appropriate behavior.  Eye Contact:  Good  Speech:  Clear and Coherent and Normal Rate  Volume:  Normal  Mood:  Euthymic  Affect:  Appropriate and Full Range  Thought Process:  Linear  Orientation:  Full (Time, Place, and Person)  Thought Content:  No delusional theme. No preoccupation with violent thoughts. No negative ruminations. No obsession.  No hallucination in any modality.   Suicidal Thoughts:  No  Homicidal Thoughts:  No  Memory:  Immediate;   Good Recent;   Good Remote;   Good  Judgement:  Good  Insight:  Good  Psychomotor Activity:  Normal  Concentration:  Concentration: Good and Attention Span: Good  Recall:  Good  Fund of Knowledge:  Good  Language:  Good  Akathisia:  Negative  Handed:  AIMS (if indicated):     Assets:  Communication Skills Desire for [place] Talents/Skills Transportation Vocational/Educational  ADL's:  Intact  Cognition:  WNL  Sleep:  Number of Hours: 6.75     Treatment Plan Summary: Patient is responding well to her current regimen.  Depression is lifting. Biological and psychological functions are normalizing. She is no longer having any suicidal thoughts. She is looking forward to getting back into her school work. We plan to gather collateral and feedback from her family. We plan to evaluate her further. Hopeful discharge tomorrow if she maintains progress.   Psychiatric: MDD   Medical:  Psychosocial:  Academic demands  PLAN: 1. Continue Citalopram at current dose 2. Collateral and feedback from her family 3. Continue to monitor mood, behavior and interaction with peers    Artist [place], MD 09/28/2017, 10:49 AM

## 2017-09-28 NOTE — Progress Notes (Signed)
CSW spoke to [employer] Alexander at [facility].  [phone].  They do a case management intake appt in situations like this to assess pt needs.  They do offer med mgmt services.   Garner NashGregory Jayana Kotula, MSW, LCSW Clinical Social Worker 09/28/2017 4:06 PM

## 2017-09-28 NOTE — Progress Notes (Signed)
Adult Psychoeducational Group Note  Date:  09/28/2017 Time:  8:53 PM  Group Topic/Focus:  Wrap-Up Group:   The focus of this group is to help patients review their daily goal of treatment and discuss progress on daily workbooks.  Participation Level:  Active  Participation Quality:  Appropriate  Affect:  Appropriate  Cognitive:  Oriented  Insight: Good  Engagement in Group:  Engaged  Modes of Intervention:  Activity  Additional Comments:  Patient rated her day a 10 and goal is to remain optimistic.  Natasha MeadKiara M Jie Stickels 09/28/2017, 8:53 PM

## 2017-09-28 NOTE — Progress Notes (Signed)
D: Pt was in the dayroom upon initial approach.  Pt presents with appropriate affect and anxious mood.  Describes her day as "pretty good" and reports goal is "to sleep better."  Pt reports her step mother came to visit and it was "a little awkward."  Pt denies SI/HI, denies hallucinations, denies pain.  Pt has been visible in milieu interacting with peers and staff appropriately.  Pt attended evening group.    A: Introduced self to pt.  Actively listened to pt and offered support and encouragement. PRN medication administered for anxiety per request.  Q15 minute safety checks maintained.  R: Pt is safe on the unit.  Pt is compliant with medication.  Pt verbally contracts for safety.  Will continue to monitor and assess.

## 2017-09-29 MED ORDER — CITALOPRAM HYDROBROMIDE 10 MG PO TABS: 10.0000 mg | ORAL_TABLET | Freq: Every day | ORAL | 0 refills | Status: AC

## 2017-09-29 MED ORDER — TRAZODONE HCL 50 MG PO TABS: 50.0000 mg | ORAL_TABLET | Freq: Every evening | ORAL | 0 refills | Status: AC | PRN

## 2017-09-29 MED ORDER — HYDROXYZINE HCL 25 MG PO TABS: 25.0000 mg | ORAL_TABLET | Freq: Three times a day (TID) | ORAL | 0 refills | Status: AC | PRN

## 2017-09-29 NOTE — Progress Notes (Signed)
  [facility] :  Will you be returning to the same living situation after discharge:  Yes,  own apartment At discharge, do you have transportation home?: Yes,  father Do you have the ability to pay for your medications: Yes,  [facility]  Release of information consent forms completed and in the chart;  Patient's signature needed at discharge.  Patient to Follow up at: Follow-up Information    [facility]. Go on 10/21/2017.   Why:  Please attend your case management intake appointment on Thursday, October 21, 2017, at 4:00pm with Ellie LunchLee Norwood.  Please bring a copy of your hospital discharge paperwork. Contact information: [facility]  [address]  [place], [place] [id number] P: [phone] F: [phone]       [facility] Follow [facility].   Why:  If needed, please attend a walk in [facility] at [facility], Monday-Friday, between 8am-3pm.  Please bring a copy of your hospital discharge paperwork. Contact information: [address] [place] [address] [phone]           Next level of care provider has access to [facility] Link:no  Safety Planning and Suicide Prevention discussed: Yes,  with father  Have you used any form of tobacco in the last 30 days? (Cigarettes, Smokeless Tobacco, Cigars, and/or Pipes): No  Has patient been referred to the Quitline?: N/A patient is not a smoker  Patient has been referred for addiction treatment: Yes   Pt will be [facility], who is closed for Christmas break until January 8.  Pt reports she will be spending time both [place] over the break and can [facility] before that if necessary prior to January 8.  Lorri FrederickWierda, Diamond Clum Jon, LCSW 09/29/2017, 10:28 AM

## 2017-09-29 NOTE — [facility] Suicide Risk Assessment (Signed)
[facility]   Principal Problem: Severe recurrent major depression without psychotic features [facility](HCC) Discharge Diagnoses:  Patient Active Problem List   Diagnosis Date Noted  . Major depressive disorder, single episode, severe without psychotic features (HCC) [F32.2] 09/25/2017  . Severe recurrent major depression without psychotic features (HCC) [F33.2] 09/25/2017    Total Time spent with patient: 30 minutes  Musculoskeletal: Strength & Muscle Tone: within normal limits Gait & Station: normal Patient leans: N/A  Psychiatric Specialty Exam: Review of Systems  Constitutional: Negative.   HENT: Negative.   Eyes: Negative.   Respiratory: Negative.   Cardiovascular: Negative.   Gastrointestinal: Negative.   Genitourinary: Negative.   Musculoskeletal: Negative.   Skin: Negative.   Neurological: Negative.   Endo/Heme/Allergies: Negative.   Psychiatric/Behavioral: Negative for depression, hallucinations, memory loss, substance abuse and suicidal ideas. The patient is not nervous/anxious and does not have insomnia.     Blood pressure (!) 95/54, pulse 88, temperature 98.4 F (36.9 C), temperature source Oral, resp. rate 18, height 5\' 2"  (1.575 m), weight 49.9 kg (110 lb).Body mass index is 20.12 kg/m.  General Appearance: Neatly dressed, pleasant, engaging well and cooperative. Appropriate behavior. Not in any distress. Good relatedness. Not internally stimulated.  Eye Contact::  Good  Speech:  Spontaneous, normal prosody. Normal tone and rate.   Volume:  Normal  Mood:  Euthymic  Affect:  Appropriate and Full Range  Thought Process:  Linear  Orientation:  Full (Time, Place, and Person)  Thought Content:  Future oriented. No delusional theme. No preoccupation with violent thoughts. No negative ruminations. No obsession.  No hallucination in any modality.   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 No past history of mental illness. Involuntarily committed by her father. Reported to have been very depressed recently. She sent a text to her best friend and ex- boyfriend expressing suicidal thoughts. She has been making statements about hopelessness and worthlessness to her father and step mom. She has not been eating well. She has lost a lot of weight. She has been very stressed lately. School demands have been very challenging for her. She has a history of cutting. She cut self recently.  Routine labs are within normal limits. Toxicology is negative. No substance use.   Nursing staff reports that patient has been appropriate on the unit. Patient has been interacting well with peers. No behavioral issues. Patient has not voiced any suicidal thoughts. Patient has not been observed to be internally stimulated. Patient has been adherent with treatment recommendations. Patient has been tolerating their medication well.   Seen today. Reports that she is in good spirits. Not feeling depressed. Reports normal energy and interest. She has been maintaining normal biological functions. She is able to think clearly. She is able to focus on task. Her thoughts are not crowded or racing. No evidence of mania. No hallucination in  any modality. She is not making any delusional statement. No passivity of will/thought. She is fully in touch with reality. No thoughts of suicide. No thoughts of homicide. No  violent thoughts. No overwhelming anxiety. No new stressors. No access to weapons. Patient has been tolerating her medications well. No evidence of dissociation.   Patient was discussed at team. Team members feels that patient is back to her baseline level of function. Team agrees with plan to discharge patient today.   Demographic Factors:  NA  Loss Factors: Financial problems/change in socioeconomic status  Historical Factors: Impulsivity  Risk Reduction Factors:   Sense of responsibility to family, Living with another person, especially a relative, Positive social support, Positive therapeutic relationship and Positive coping skills or problem solving skills  Continued Clinical Symptoms:  As above   Cognitive Features That Contribute To Risk:  None    Suicide Risk:  Minimal: No identifiable suicidal ideation.  Patient is not having any thoughts of suicide at this time. Modifiable risk factors targeted during this admission includes depression. Demographical and historical risk factors cannot be modified. Patient is now engaging well. Patient is reliable and is future oriented. We have buffered patient's support structures. At this point, patient is at low risk of suicide. Patient is aware of the effects of psychoactive substances on decision making process. Patient has been provided with emergency contacts. Patient acknowledges to use resources provided if unforseen circumstances changes their current risk stratification.    Follow-up Information    [facility] Go on [facility]/21/2017.   Why:  Please attend your case management intake appointment on Thursday, October 21, 2017, at 4:00pm with Ellie LunchLee Norwood.  Please bring a copy of your hospital discharge paperwork. Contact information: [facility]  [address]  [place], [place] [address] P: [phone] F: [phone]       [facility]  Follow up.   Why:  If needed, please attend a walk in appointment at [facility], Monday-Friday, between 8am-3pm.  Please bring a copy of your hospital discharge paperwork. Contact information: [address] [place] [address] [phone]           Plan Of Care/Follow-up recommendations:  1. Continue current psychotropic medications 2. Mental health and addiction follow up as arranged.  3. Discharge in care of her family 4. Provided limited quantity of prescriptions  Georgiann CockerVincent A Izediuno, MD 09/29/2017, 11:06 AM

## 2017-09-29 NOTE — Progress Notes (Signed)
Discharge note:  Patient discharged per MD order.  She received all personal belongings from unit and locker.  Reviewed AVS/transition record with patient and she indicated understanding.  [facility].  She denies any thoughts of self harm.  Patient left ambulatory with his dad.

## 2017-09-29 NOTE — Discharge Summary (Signed)
Physician Discharge Summary Note  Patient:  Diamond West is an 21 y.o., female MRN:  [id number] DOB:  03-Dec-1995 Patient phone:  [phone] (home)  Patient address:   [address] [place] [address],  Total Time spent with patient: 20 minutes  Date of Admission:  09/25/2017 Date of Discharge: 09/29/17  Reason for Admission:  Worsening depression with self harm  Principal Problem: Severe recurrent major depression without psychotic features [facility]) Discharge Diagnoses: Patient Active Problem List   Diagnosis Date Noted  . Major depressive disorder, single episode, severe without psychotic features (HCC) [F32.2] 09/25/2017  . Severe recurrent major depression without psychotic features (HCC) [F33.2] 09/25/2017    Past Psychiatric History: no prior psychiatric admissions, reports history of depression particularly over the last 6 months, denies history of mania, hypomania . Denies history of psychosis. Denies history of violence. Does not endorse history of PTSD . Denies history of eating disorder.  Denies history of repeated self cutting, but states she has cut self once before (two years ago)  Past Medical History: History reviewed. No pertinent past medical history. History reviewed. No pertinent surgical history. Family History: History reviewed. No pertinent family history. Family Psychiatric  History: States half sister has history of Eating Disorder, no suicides in family, no substance abuse in family   Social History:  Social History   Substance and Sexual Activity  Alcohol Use No     Social History   Substance and Sexual Activity  Drug Use No    Social History   Socioeconomic History  . Marital status: Single    Spouse name: None  . Number of children: None  . Years of education: None  . Highest education level: None  Social Needs  . Financial resource strain: None  . Food insecurity - worry: None  . Food insecurity - inability: None  .  Transportation needs - medical: None  . Transportation needs - non-medical: None  Occupational History  . None  Tobacco Use  . Smoking status: Never Smoker  . Smokeless tobacco: Never Used  Substance and Sexual Activity  . Alcohol use: No  . Drug use: No  . Sexual activity: None  Other Topics Concern  . None  Social History Narrative  . None    Hospital Course:   09/25/17 [facility] Counselor Assessment: 21 y.o. female that presents this date with IVC. Per IVC: " Respondent has not been eating and just wanting to stay in bed. Respondent on 09/23/17 text a friend that she wanted to die and had thoughts of dying all the time. Respondent had cut her left leg when police responded that night. Respondent has lost weight due to not eating. Respondent continues to make statements to father and step mom about wanting to die". Patient presents with S/I this date reporting that she cut her leg with the intent to "bleed out." Patient states she continues to have thoughts of self harm at the time of admission. Patient reports she has never been hospitalized but states she has attempted to cut herself "4 or more times in her life." Patient states all the cuts were superficial although at the time patient reports that she "thought they were enough to end her life." Patient denies any history of OP treatment or medication management. Patient reports she feels she has been "depressed for years" with symptoms increasing within the last year due to school stress. Patient reports symptoms to include: anhedonia, fatigue and isolating. Patient also reports decreased appetite although  is unsure in reference to weight loss. Patient is time/place oriented and denies any H/I or AVH.  Patient reports that she is currently seeing a counselor at [facility] but has only seen them "a couple times." Patient states she has been unwilling to seek treatment because "she really doesn't care." Patient is observed to be tearful during  assessment and states she would like to "get some help I guess." Patient is open to medication and therapeutic  Interventions. Patient renders limited history and seems to disassociate at times. Per notes, "Patient presents to the ED under IVC for SI with a plan to "bleed out" of a cut on her leg. Patient states that over the last 6 months she has been under a lot of stress related to family and school, and has been feeling more depressed and suicidal. Two days ago she decided to cut her left thigh superficially and intended to "bleed out" from this. She has also previously attempted overdose several months ago but has not done so today. Her father obtained IVC paperwork which states "respondent has not been eating and has just been wanting to stay in bed. Respondent on the 09/23/17 texted a friend that she wanted to die and had thoughts of dying all the time. Respondent had cut her left leg when police responded that night. Respondent has lost a significant amount of weight and refuses to eat. Respondent continues to make statements to father and step mom about wanting to die. Respondent has become reclusive and is dissociating from her family and friends." She states that she sees a Veterinary surgeon [facility] but is not prescribed any psychiatric medications. She denies HI, AVH, drug use, alcohol use, or tobacco use. She reports that she gets nauseous after eating and has lost her appetite due to this".  Case was staffed with Shaune Pollack DNP who recommended a inpatient admission as appropriate bed placement is investigated.    09/26/17 [facility] MD Assessment: 21 year old female, college student, currently staying with her father  for Winter break. Admitted under commitment generated by her father. States she has a history of depression, anxiety.States she recently called her parents due to worsening depression and recent episode of self cutting . States she has a history of one prior episode of self cutting two years ago.  Endorses passive SI, such as  Thinking " I hope I do not wake up", but denies actual suicidal plans or intention. States that in addition to depression, she feels that anxiety has been a major issue for her as well . States she feels she worries excessively . Endorses neuro-vegetative symptoms of depression as below, including significant weight loss recently . Identifies recent relationship break up and recent final exams/academic stressors as contributors to her depression.   Patient [facility] unit for 3 days and stabilized with medications and therapy. Patient was started on Celexa 10 mg Daily and provided Trazodone and Vistaril PRN. Patient has shown improvement with improved mod affect, appetite, mood, sleep, and interaction. Patient has been seen in the day room interacting with peers and staff appropriately. Patient attended group sand participated. Patient denies any SI/HI/AVh and contracts for safety. Patient is provided with prescriptions for her medications upon discharge.    Physical Findings: AIMS: Facial and Oral Movements Muscles of Facial Expression: None, normal Lips and Perioral Area: None, normal Jaw: None, normal Tongue: None, normal,Extremity Movements Upper (arms, wrists, hands, fingers): None, normal Lower (legs, knees, ankles, toes): None, normal, Trunk Movements  Neck, shoulders, hips: None, normal, Overall Severity Severity of abnormal movements (highest score from questions above): None, normal Incapacitation due to abnormal movements: None, normal Patient's awareness of abnormal movements (rate only patient's report): No Awareness, Dental Status Current problems with teeth and/or dentures?: No Does patient usually wear dentures?: No  CIWA:    COWS:     Musculoskeletal: Strength & Muscle Tone: within normal limits Gait & Station: normal Patient leans: N/A  Psychiatric Specialty Exam: Physical Exam  Nursing note and vitals reviewed. Constitutional:  She is oriented to person, place, and time. She appears well-developed and well-nourished.  Cardiovascular: Normal rate.  Respiratory: Effort normal.  Musculoskeletal: Normal range of motion.  Neurological: She is alert and oriented to person, place, and time.  Skin: Skin is warm.    Review of Systems  Constitutional: Negative.   HENT: Negative.   Eyes: Negative.   Respiratory: Negative.   Cardiovascular: Negative.   Gastrointestinal: Negative.   Genitourinary: Negative.   Musculoskeletal: Negative.   Skin: Negative.   Neurological: Negative.   Endo/Heme/Allergies: Negative.   Psychiatric/Behavioral: Negative.     Blood pressure (!) 95/54, pulse 88, temperature 98.4 F (36.9 C), temperature source Oral, resp. rate 18, height 5\' 2"  (1.575 m), weight 49.9 kg (110 lb).Body mass index is 20.12 kg/m.  General Appearance: Casual  Eye Contact:  Good  Speech:  Clear and Coherent and Normal Rate  Volume:  Normal  Mood:  Euthymic  Affect:  Appropriate  Thought Process:  Goal Directed and Descriptions of Associations: Intact  Orientation:  Full (Time, Place, and Person)  Thought Content:  WDL  Suicidal Thoughts:  No  Homicidal Thoughts:  No  Memory:  Immediate;   Good Recent;   Good Remote;   Good  Judgement:  Good  Insight:  Good  Psychomotor Activity:  Normal  Concentration:  Concentration: Good and Attention Span: Good  Recall:  Good  Fund of Knowledge:  Good  Language:  Good  Akathisia:  No  Handed:  Right  AIMS (if indicated):     Assets:  Communication Skills Desire for Improvement Financial Resources/Insurance Housing Physical Health Social Support Transportation Vocational/Educational  ADL's:  Intact  Cognition:  WNL  Sleep:  Number of Hours: 6.75     Have you used any form of tobacco in the last 30 days? (Cigarettes, Smokeless Tobacco, Cigars, and/or Pipes): No  Has this patient used any form of tobacco in the last 30 days? (Cigarettes, Smokeless Tobacco,  Cigars, and/or Pipes) Yes, No  Blood Alcohol level:  Lab Results  Component Value Date   ETH <10 09/25/2017    Metabolic Disorder Labs:  Lab Results  Component Value Date   HGBA1C 5.2 09/27/2017   MPG 103 09/27/2017   No results found for: PROLACTIN Lab Results  Component Value Date   CHOL 249 (H) 09/27/2017   TRIG 126 09/27/2017   HDL 59 09/27/2017   CHOLHDL 4.2 09/27/2017   VLDL 25 09/27/2017   LDLCALC 165 (H) 09/27/2017    See Psychiatric Specialty Exam and Suicide Risk Assessment completed by Attending Physician prior to discharge.  Discharge destination:  Home  Is patient on multiple antipsychotic therapies at discharge:  No   Has Patient had three or more failed trials of antipsychotic monotherapy by history:  No  Recommended Plan for Multiple Antipsychotic Therapies: NA   Allergies as of 09/29/2017   No Known Allergies     Medication List    STOP taking these medications  ibuprofen 200 MG tablet Commonly known as:  ADVIL,MOTRIN     TAKE these medications     Indication  citalopram 10 MG tablet Commonly known as:  CELEXA Take 1 tablet (10 mg total) by mouth daily. For mood control Start taking on:  09/30/2017  Indication:  mood stability   hydrOXYzine 25 MG tablet Commonly known as:  ATARAX/VISTARIL Take 1 tablet (25 mg total) by mouth 3 (three) times daily as needed for anxiety.  Indication:  Feeling Anxious   JUNEL FE 1/20 1-20 MG-MCG tablet Generic drug:  norethindrone-ethinyl estradiol Take 1 tablet by mouth daily.  Indication:  Birth Control Treatment   traZODone 50 MG tablet Commonly known as:  DESYREL Take 1 tablet (50 mg total) by mouth at bedtime as needed for sleep.  Indication:  Trouble Sleeping        Follow-up recommendations:  Continue activity as tolerated. Continue diet as recommended by your PCP. Ensure to keep all appointments with outpatient providers.  Comments:  Patient is instructed prior to discharge to: Take  all medications as prescribed by his/her mental healthcare provider. Report any adverse effects and or reactions from the medicines to his/her outpatient provider promptly. Patient has been instructed & cautioned: To not engage in alcohol and or illegal drug use while on prescription medicines. In the event of worsening symptoms, patient is instructed to call the crisis hotline, 911 and or go to the nearest ED for appropriate evaluation and treatment of symptoms. To follow-up with his/her primary care provider for your other medical issues, concerns and or health care needs.    Signed: Gerlene Burdockravis B Money, FNP 09/29/2017, 8:12 AM

## 2017-09-29 NOTE — Progress Notes (Signed)
Recreation Therapy Notes  Date: 09/29/17 Time: 0930 Location: 300 Hall Dayroom  Group Topic: Stress Management  Goal Area(s) Addresses:  Patient will verbalize importance of using healthy stress management.  Patient will identify positive emotions associated with healthy stress management.   Intervention: Stress Management  Activity : Guided Imagery.  LRT introduced the stress management technique of guided imagery.  Patients were to listen and follow along as LRT read script to fully engage in the technique.  Education:  Stress Management, Discharge Planning.   Education Outcome: Acknowledges edcuation/In group clarification offered/Needs additional education  Clinical Observations/Feedback: Pt did not attend group.    Jhana Giarratano, LRT/CTRS         Zack Crager A 09/29/2017 12:38 PM 

## 2017-09-29 NOTE — Plan of Care (Signed)
  Completed/Met Activity: Interest or engagement in activities will improve 09/29/2017 1108 - Completed/Met by Joice Lofts, RN Sleeping patterns will improve 09/29/2017 1108 - Completed/Met by Joice Lofts, RN Education: Knowledge of [place] Education information/materials will improve 09/29/2017 1108 - Completed/Met by Joice Lofts, RN Emotional status will improve 09/29/2017 1108 - Completed/Met by Joice Lofts, RN Mental status will improve 09/29/2017 1108 - Completed/Met by Joice Lofts, RN Verbalization of understanding the information provided will improve 09/29/2017 1108 - Completed/Met by Joice Lofts, RN Coping: Ability to verbalize frustrations and anger appropriately will improve 09/29/2017 1108 - Completed/Met by Joice Lofts, RN Ability to demonstrate self-control will improve 09/29/2017 1108 - Completed/Met by Joice Lofts, RN Health Behavior/Discharge Planning: Identification of resources available to assist in meeting health care needs will improve 09/29/2017 1108 - Completed/Met by Joice Lofts, RN Compliance with treatment plan for underlying cause of condition will improve 09/29/2017 1108 - Completed/Met by Joice Lofts, RN Physical Regulation: Ability to maintain clinical measurements within normal limits will improve 09/29/2017 1108 - Completed/Met by Joice Lofts, RN Safety: Periods of time without injury will increase 09/29/2017 1108 - Completed/Met by Joice Lofts, RN Activity: Interest or engagement in leisure activities will improve 09/29/2017 1108 - Completed/Met by Joice Lofts, RN Imbalance in normal sleep/wake cycle will improve 09/29/2017 1108 - Completed/Met by Joice Lofts, RN Education: Utilization of techniques to improve thought processes will improve 09/29/2017 1108 - Completed/Met by Joice Lofts, RN Knowledge of the  prescribed therapeutic regimen will improve 09/29/2017 1108 - Completed/Met by Joice Lofts, RN Coping: Ability to cope will improve 09/29/2017 1108 - Completed/Met by Joice Lofts, RN Ability to verbalize feelings will improve 09/29/2017 1108 - Completed/Met by Joice Lofts, [place] Behavior/Discharge Planning: Ability to make decisions will improve 09/29/2017 1108 - Completed/Met by Joice Lofts, RN Compliance with therapeutic regimen will improve 09/29/2017 1108 - Completed/Met by Joice Lofts, RN Role Relationship: Ability to demonstrate positive changes in social behaviors and relationships will improve 09/29/2017 1108 - Completed/Met by Joice Lofts, RN Safety: Ability to disclose and discuss suicidal ideas will improve 09/29/2017 1108 - Completed/Met by Joice Lofts, RN Ability to identify and utilize support systems that promote safety will improve 09/29/2017 1108 - Completed/Met by Joice Lofts, RN Self-Concept: Ability to verbalize positive feelings about self will improve 09/29/2017 1108 - Completed/Met by Joice Lofts, RN Level of anxiety will decrease 09/29/2017 1108 - Completed/Met by Joice Lofts, RN Education: Ability to make informed decisions regarding treatment will improve 09/29/2017 1108 - Completed/Met by Joice Lofts, RN Coping: Ability to cope will improve 09/29/2017 1108 - Completed/Met by Joice Lofts, [place] Behavior/Discharge Planning: Identification of resources available to assist in meeting health care needs will improve 09/29/2017 1108 - Completed/Met by Joice Lofts, RN Medication: Compliance with prescribed medication regimen will improve 09/29/2017 1108 - Completed/Met by Joice Lofts, RN Self-Concept: Ability to disclose and discuss suicidal ideas will improve 09/29/2017 1108 - Completed/Met by Joice Lofts, RN Ability to verbalize  positive feelings about self will improve 09/29/2017 1108 - Completed/Met by Joice Lofts, RN

## 2017-09-30 NOTE — Progress Notes (Signed)
CSW received call from Cheraw[facility]avelle [facility].  [phone].  [facility] and has available appointment on Wed/Thurs/Fri 12/26-12/28. CSW tentatively set up appt for 12/28 at 2pm.  Javelle said pt needs to call to confirm this. CSW called pt father and he gave me pt  Phone number: [phone].  Pt is back in [place] already.  CSW called pt and left message with [facility] contact number.    CS[facility] also [facility].  [phone] and updated her on this.  They will reach out to pt if she does not show for the appt there.  Garner NashGregory Trelon Plush, MSW, LCSW Clinical Social Worker 09/30/2017 11:43 AM

## 2018-07-10 ENCOUNTER — Emergency Department (HOSPITAL_COMMUNITY): Payer: Non-veteran care

## 2018-07-10 ENCOUNTER — Emergency Department (HOSPITAL_COMMUNITY)
Admission: EM | Admit: 2018-07-10 | Discharge: 2018-07-10 | Disposition: A | Discharge status: Home / Self Care | Payer: Non-veteran care | Attending: Emergency Medicine | Admitting: Emergency Medicine

## 2018-07-10 ENCOUNTER — Encounter (HOSPITAL_COMMUNITY): Payer: Self-pay | Admitting: Emergency Medicine

## 2018-07-10 DIAGNOSIS — R52 Pain, unspecified: Secondary | ICD-10-CM

## 2018-07-10 DIAGNOSIS — Y9352 Activity, horseback riding: Secondary | ICD-10-CM | POA: Insufficient documentation

## 2018-07-10 DIAGNOSIS — S0990XA Unspecified injury of head, initial encounter: Secondary | ICD-10-CM | POA: Insufficient documentation

## 2018-07-10 DIAGNOSIS — S7002XA Contusion of left hip, initial encounter: Secondary | ICD-10-CM | POA: Insufficient documentation

## 2018-07-10 DIAGNOSIS — Y929 Unspecified place or not applicable: Secondary | ICD-10-CM | POA: Insufficient documentation

## 2018-07-10 DIAGNOSIS — Y999 Unspecified external cause status: Secondary | ICD-10-CM | POA: Insufficient documentation

## 2018-07-10 LAB — CBC WITH DIFFERENTIAL/PLATELET
Abs Immature Granulocytes: 0 10*3/uL (ref 0.0–0.1)
BASOS ABS: 0.1 10*3/uL (ref 0.0–0.1)
Basophils Relative: 1 %
Eosinophils Absolute: 0 10*3/uL (ref 0.0–0.7)
Eosinophils Relative: 0 %
HEMATOCRIT: 41.3 % (ref 36.0–46.0)
HEMOGLOBIN: 13.3 g/dL (ref 12.0–15.0)
IMMATURE GRANULOCYTES: 0 %
LYMPHS ABS: 2.4 10*3/uL (ref 0.7–4.0)
LYMPHS PCT: 25 %
MCH: 28.6 pg (ref 26.0–34.0)
MCHC: 32.2 g/dL (ref 30.0–36.0)
MCV: 88.8 fL (ref 78.0–100.0)
Monocytes Absolute: 0.4 10*3/uL (ref 0.1–1.0)
Monocytes Relative: 4 %
NEUTROS PCT: 70 %
Neutro Abs: 6.9 10*3/uL (ref 1.7–7.7)
Platelets: 289 10*3/uL (ref 150–400)
RBC: 4.65 MIL/uL (ref 3.87–5.11)
RDW: 12.1 % (ref 11.5–15.5)
WBC: 9.9 10*3/uL (ref 4.0–10.5)

## 2018-07-10 LAB — BASIC METABOLIC PANEL
ANION GAP: 11 (ref 5–15)
BUN: 16 mg/dL (ref 6–20)
CHLORIDE: 109 mmol/L (ref 98–111)
CO2: 18 mmol/L — ABNORMAL LOW (ref 22–32)
Calcium: 10.3 mg/dL (ref 8.9–10.3)
Creatinine, Ser: 0.93 mg/dL (ref 0.44–1.00)
Glucose, Bld: 92 mg/dL (ref 70–99)
POTASSIUM: 3.2 mmol/L — AB (ref 3.5–5.1)
Sodium: 138 mmol/L (ref 135–145)

## 2018-07-10 LAB — I-STAT BETA HCG BLOOD, ED (MC, WL, AP ONLY)

## 2018-07-10 MED ORDER — MORPHINE SULFATE (PF) 4 MG/ML IV SOLN
4.0000 mg | Freq: Once | INTRAVENOUS | Status: AC
  Administered 2018-07-10: 4 mg via INTRAVENOUS
  Filled 2018-07-10: qty 1

## 2018-07-10 MED ORDER — FENTANYL CITRATE (PF) 100 MCG/2ML IJ SOLN
50.0000 ug | Freq: Once | INTRAMUSCULAR | Status: AC
  Administered 2018-07-10: 50 ug via INTRAVENOUS
  Filled 2018-07-10: qty 2

## 2018-07-10 MED ORDER — OXYCODONE-ACETAMINOPHEN 5-325 MG PO TABS
1.0000 | ORAL_TABLET | Freq: Once | ORAL | Status: AC
  Administered 2018-07-10: 1 via ORAL
  Filled 2018-07-10: qty 1

## 2018-07-10 MED ORDER — ONDANSETRON HCL 4 MG/2ML IJ SOLN
4.0000 mg | Freq: Once | INTRAMUSCULAR | Status: AC
  Administered 2018-07-10: 4 mg via INTRAVENOUS
  Filled 2018-07-10: qty 2

## 2018-07-10 MED ORDER — IBUPROFEN 600 MG PO TABS: 600.0000 mg | ORAL_TABLET | Freq: Four times a day (QID) | ORAL | 0 refills | Status: AC | PRN

## 2018-07-10 MED ORDER — OXYCODONE-ACETAMINOPHEN 5-325 MG PO TABS: 1.0000 | ORAL_TABLET | ORAL | 0 refills | Status: AC | PRN

## 2018-07-10 NOTE — ED Notes (Signed)
Patient transported to X-ray 

## 2018-07-10 NOTE — ED Triage Notes (Signed)
Pt here via POV after her horse bucked her off from about 5 feet up. Pt landed on her left hip, put unsure if she hit her head but denies LOC. Pt A&O x4.

## 2018-07-10 NOTE — ED Provider Notes (Signed)
[facility] EMERGENCY DEPARTMENT Provider Note   CSN: [id number] Arrival date & time: 07/10/18  1614     History   Chief Complaint Chief Complaint  Patient presents with  . Fall    HPI Diamond West is a 22 y.o. female.  Pt presents to the ED today with left hip pain.  The pt was riding a horse which bucked and knocked her off.  The pt landed on her left hip.  She thinks she hit her head, but had no loc.     History reviewed. No pertinent past medical history.  Patient Active Problem List   Diagnosis Date Noted  . Major depressive disorder, single episode, severe without psychotic features (HCC) 09/25/2017  . Severe recurrent major depression without psychotic features (HCC) 09/25/2017    History reviewed. No pertinent surgical history.   OB History   None      Home Medications    Prior to Admission medications   Medication Sig Start Date End Date Taking? Authorizing Provider  citalopram (CELEXA) 10 MG tablet Take 1 tablet (10 mg total) by mouth daily. For mood control Patient not taking: Reported on 07/10/2018 09/30/17   Money, Gerlene Burdock, FNP  hydrOXYzine (ATARAX/VISTARIL) 25 MG tablet Take 1 tablet (25 mg total) by mouth 3 (three) times daily as needed for anxiety. Patient not taking: Reported on 07/10/2018 09/29/17   Money, Gerlene Burdock, FNP  ibuprofen (ADVIL,MOTRIN) 600 MG tablet Take 1 tablet (600 mg total) by mouth every 6 (six) hours as needed. 07/10/18   Jacalyn Lefevre, MD  oxyCODONE-acetaminophen (PERCOCET/ROXICET) 5-325 MG tablet Take 1 tablet by mouth every 4 (four) hours as needed for severe pain. 07/10/18   Jacalyn Lefevre, MD  traZODone (DESYREL) 50 MG tablet Take 1 tablet (50 mg total) by mouth at bedtime as needed for sleep. Patient not taking: Reported on 07/10/2018 09/29/17   Money, Gerlene Burdock, FNP    Family History History reviewed. No pertinent family history.  Social History Social History   Tobacco Use  . Smoking status:  Never Smoker  . Smokeless tobacco: Never Used  Substance Use Topics  . Alcohol use: No  . Drug use: No     Allergies   Patient has no known allergies.   Review of Systems Review of Systems  Musculoskeletal:       Left hip pain  All other systems reviewed and are negative.    Physical Exam Updated Vital Signs BP 119/66   Pulse 86   Temp 98.5 F (36.9 C) (Oral)   Resp 19   Ht 5\' 2"  (1.575 m)   Wt 54.4 kg   LMP 07/09/2018   SpO2 99%   BMI 21.95 kg/m   Physical Exam  Constitutional: She is oriented to person, place, and time. She appears well-developed and well-nourished.  HENT:  Head: Normocephalic and atraumatic.  Right Ear: External ear normal.  Left Ear: External ear normal.  Nose: Nose normal.  Mouth/Throat: Oropharynx is clear and moist.  Eyes: Pupils are equal, round, and reactive to light. Conjunctivae and EOM are normal.  Neck:  In c-collar  Cardiovascular: Normal rate, regular rhythm, normal heart sounds and intact distal pulses.  Pulmonary/Chest: Effort normal and breath sounds normal.  Abdominal: Soft. Bowel sounds are normal.  Musculoskeletal:       Left hip: She exhibits tenderness and swelling.  Neurological: She is alert and oriented to person, place, and time.  Skin: Skin is warm. Capillary refill takes less than  2 seconds.  Psychiatric: She has a normal mood and affect. Her behavior is normal. Judgment and thought content normal.  Nursing note and vitals reviewed.    ED Treatments / Results  Labs (all labs ordered are listed, but only abnormal results are displayed) Labs Reviewed  BASIC METABOLIC PANEL - Abnormal; Notable for the following components:      Result Value   Potassium 3.2 (*)    CO2 18 (*)    All other components within normal limits  CBC WITH DIFFERENTIAL/PLATELET  URINALYSIS, ROUTINE W REFLEX MICROSCOPIC  I-STAT BETA HCG BLOOD, ED ([facility], [facility], [facility] ONLY)    EKG None  Radiology Dg Chest 1 View  Result Date:  07/10/2018 CLINICAL DATA:  Larey Seat off horse EXAM: CHEST  1 VIEW COMPARISON:  12/21/2007 FINDINGS: The heart size and mediastinal contours are within normal limits. Both lungs are clear. The visualized skeletal structures are unremarkable. IMPRESSION: No active disease. Electronically Signed   By: Jasmine Pang M.D.   On: 07/10/2018 18:56   Dg Pelvis 1-2 Views  Result Date: 07/10/2018 CLINICAL DATA:  Larey Seat off horse with pelvic pain hip pain EXAM: PELVIS - 1-2 VIEW COMPARISON:  None. FINDINGS: SI joints are non widened. Pubic symphysis and rami are intact. No fracture or malalignment. IMPRESSION: Negative. Electronically Signed   By: Jasmine Pang M.D.   On: 07/10/2018 18:56   Ct Head Wo Contrast  Result Date: 07/10/2018 CLINICAL DATA:  Fall off horse.  No loss of consciousness. EXAM: CT HEAD WITHOUT CONTRAST CT CERVICAL SPINE WITHOUT CONTRAST TECHNIQUE: Multidetector CT imaging of the head and cervical spine was performed following the standard protocol without intravenous contrast. Multiplanar CT image reconstructions of the cervical spine were also generated. COMPARISON:  CT scan of Feb 12, 2006. FINDINGS: CT HEAD FINDINGS Brain: No evidence of acute infarction, hemorrhage, hydrocephalus, extra-axial collection or mass lesion/mass effect. Vascular: No hyperdense vessel or unexpected calcification. Skull: Normal. Negative for fracture or focal lesion. Sinuses/Orbits: No acute finding. Other: None. CT CERVICAL SPINE FINDINGS Alignment: Normal. Skull base and vertebrae: No acute fracture. No primary bone lesion or focal pathologic process. Soft tissues and spinal canal: No prevertebral fluid or swelling. No visible canal hematoma. Disc levels:  Normal. Upper chest: Negative. Other: None. IMPRESSION: Normal head CT. Normal cervical spine. Electronically Signed   By: Lupita Raider, M.D.   On: 07/10/2018 19:20   Ct Cervical Spine Wo Contrast  Result Date: 07/10/2018 CLINICAL DATA:  Fall off horse.  No loss of  consciousness. EXAM: CT HEAD WITHOUT CONTRAST CT CERVICAL SPINE WITHOUT CONTRAST TECHNIQUE: Multidetector CT imaging of the head and cervical spine was performed following the standard protocol without intravenous contrast. Multiplanar CT image reconstructions of the cervical spine were also generated. COMPARISON:  CT scan of Feb 12, 2006. FINDINGS: CT HEAD FINDINGS Brain: No evidence of acute infarction, hemorrhage, hydrocephalus, extra-axial collection or mass lesion/mass effect. Vascular: No hyperdense vessel or unexpected calcification. Skull: Normal. Negative for fracture or focal lesion. Sinuses/Orbits: No acute finding. Other: None. CT CERVICAL SPINE FINDINGS Alignment: Normal. Skull base and vertebrae: No acute fracture. No primary bone lesion or focal pathologic process. Soft tissues and spinal canal: No prevertebral fluid or swelling. No visible canal hematoma. Disc levels:  Normal. Upper chest: Negative. Other: None. IMPRESSION: Normal head CT. Normal cervical spine. Electronically Signed   By: Lupita Raider, M.D.   On: 07/10/2018 19:20   Ct Hip Left Wo Contrast  Result Date: 07/10/2018 CLINICAL DATA:  Hip trauma, fx known or suspected, xray insufficient. Left hip pain after being thrown from horse. EXAM: CT OF THE LEFT HIP WITHOUT CONTRAST TECHNIQUE: Multidetector CT imaging of the left hip was performed according to the standard protocol. Multiplanar CT image reconstructions were also generated. COMPARISON:  Pelvis and femur radiographs earlier this day. FINDINGS: Bones/Joint/Cartilage No acute fracture. Cortical margins of the left hip are intact. Pubic rami are intact. No large hip joint effusion. Tiny benign bone island in the left inferior pubic ramus. Ligaments Suboptimally assessed by CT. Muscles and Tendons No large intramuscular hematoma. Soft tissues Negative.  No confluent soft tissue hematoma. IMPRESSION: Negative CT of the left hip.  No acute fracture. Electronically Signed   By:  Narda Rutherford M.D.   On: 07/10/2018 21:35   Dg Femur Min 2 Views Left  Result Date: 07/10/2018 CLINICAL DATA:  Larey Seat from horse, left hip pain EXAM: LEFT FEMUR 2 VIEWS COMPARISON:  None. FINDINGS: There is no evidence of fracture or other focal bone lesions. Soft tissues are unremarkable. IMPRESSION: Negative. Electronically Signed   By: Jasmine Pang M.D.   On: 07/10/2018 18:57    Procedures Procedures (including critical care time)  Medications Ordered in ED Medications  morphine 4 MG/ML injection 4 mg (4 mg Intravenous Given 07/10/18 1625)  ondansetron (ZOFRAN) injection 4 mg (4 mg Intravenous Given 07/10/18 1625)  fentaNYL (SUBLIMAZE) injection 50 mcg (50 mcg Intravenous Given 07/10/18 1653)  fentaNYL (SUBLIMAZE) injection 50 mcg (50 mcg Intravenous Given 07/10/18 2015)     Initial Impression / Assessment and Plan / ED Course  I have reviewed the triage vital signs and the nursing notes.  Pertinent labs & imaging results that were available during my care of the patient were reviewed by me and considered in my medical decision making (see chart for details).    Pt's pain has improved, but she still had left hip pain after xrays showed no fx.  Due to this, pt had a ct of her hip which was negative.  The pt does not have any other traumatic injury.  She is given crutches and instructed to return if worse.  She lives in [place] and is instructed to f/u with pcp and with ortho there.  Final Clinical Impressions(s) / ED Diagnoses   Final diagnoses:  Contusion of left hip, initial encounter    ED Discharge Orders         Ordered    oxyCODONE-acetaminophen (PERCOCET/ROXICET) 5-325 MG tablet  Every 4 hours PRN     07/10/18 2147    ibuprofen (ADVIL,MOTRIN) 600 MG tablet  Every 6 hours PRN     07/10/18 2147           Jacalyn Lefevre, MD 07/10/18 2149

## 2018-07-10 NOTE — ED Notes (Signed)
Patient transported to CT 

## 2019-06-21 IMAGING — DX DG FEMUR 2+V*L*
4 series · 4 of 4 positions shown · non-contrast
Comparison: None.

CLINICAL DATA: Fell from horse, left hip pain

EXAM:
LEFT FEMUR 2 VIEWS

[femur ap (1 of 2)]
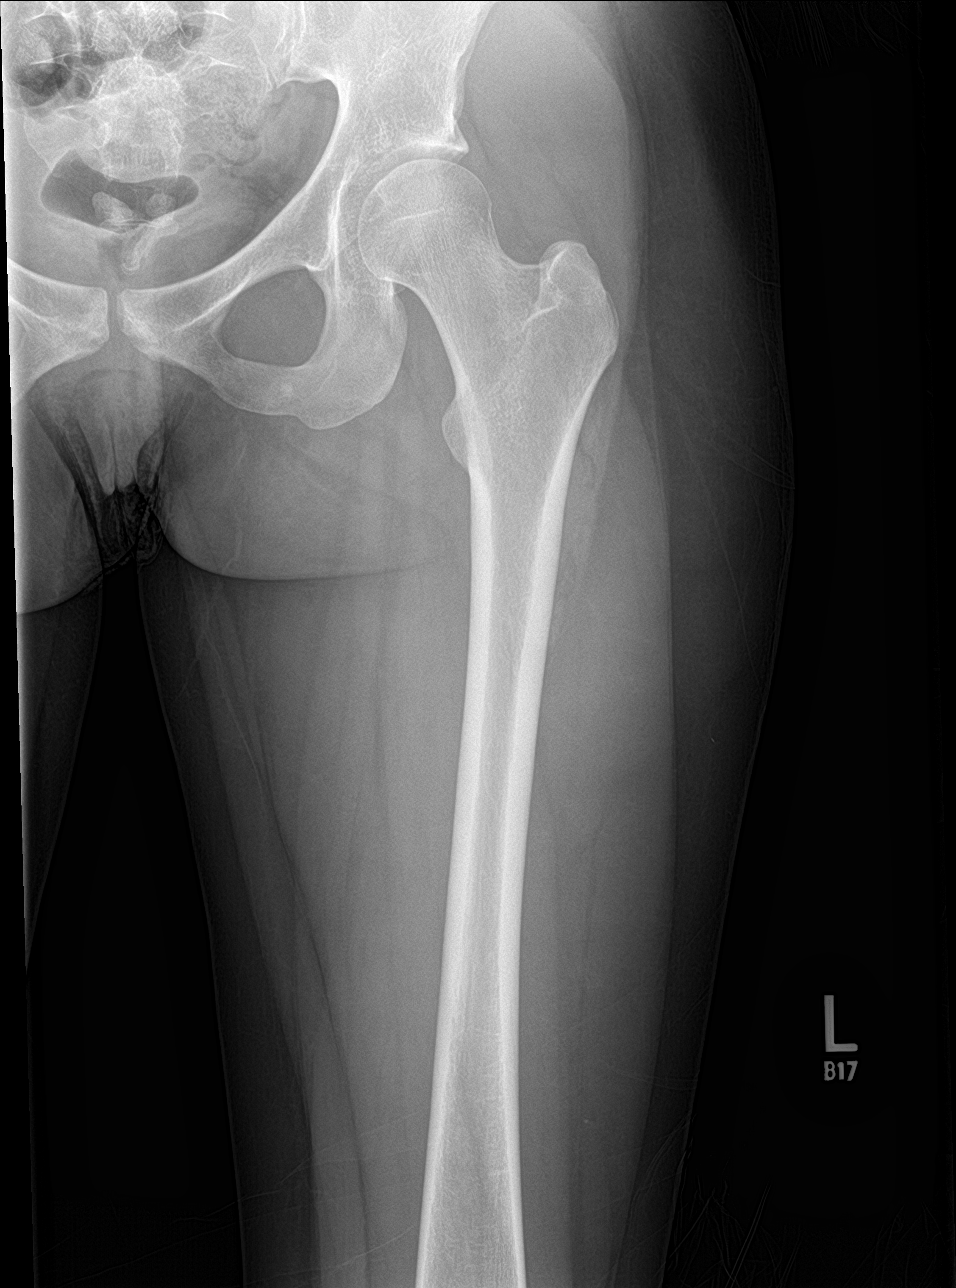

[femur ap (2 of 2)]
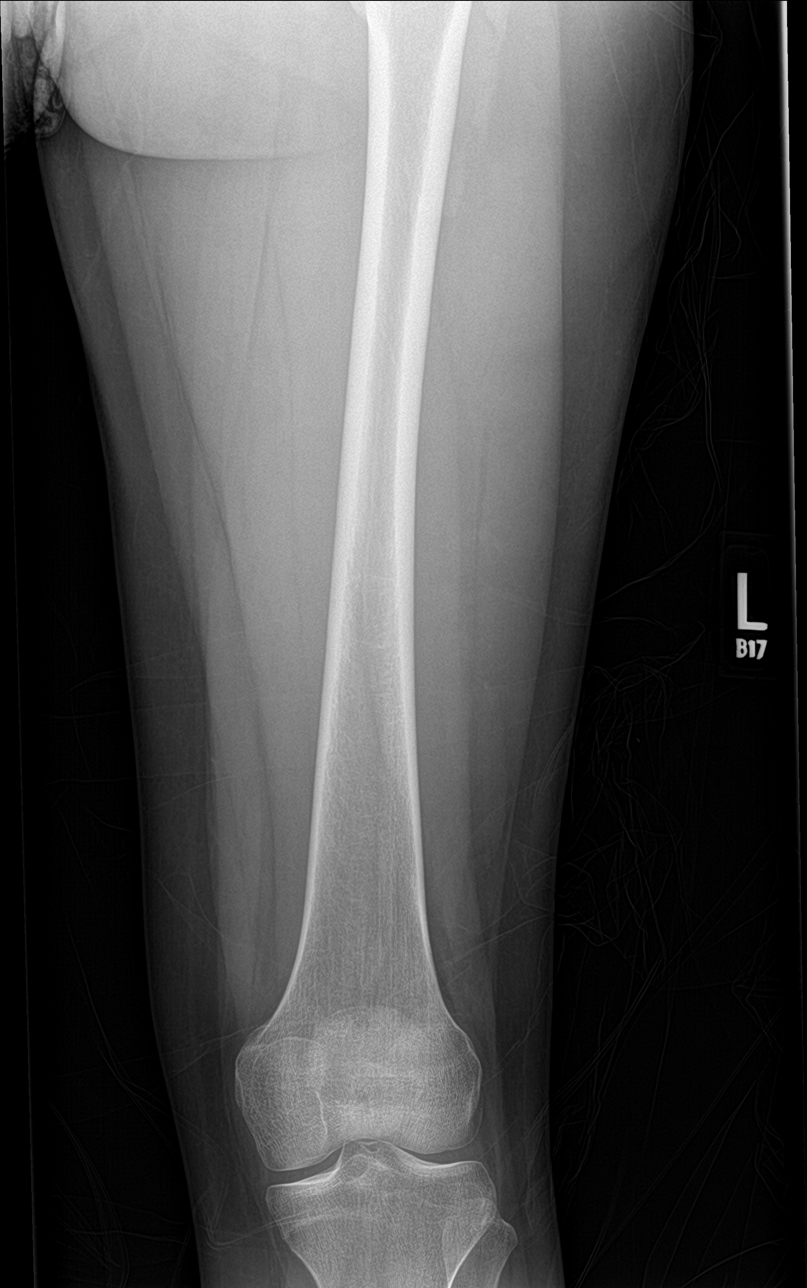

[femur lat (1 of 2)]
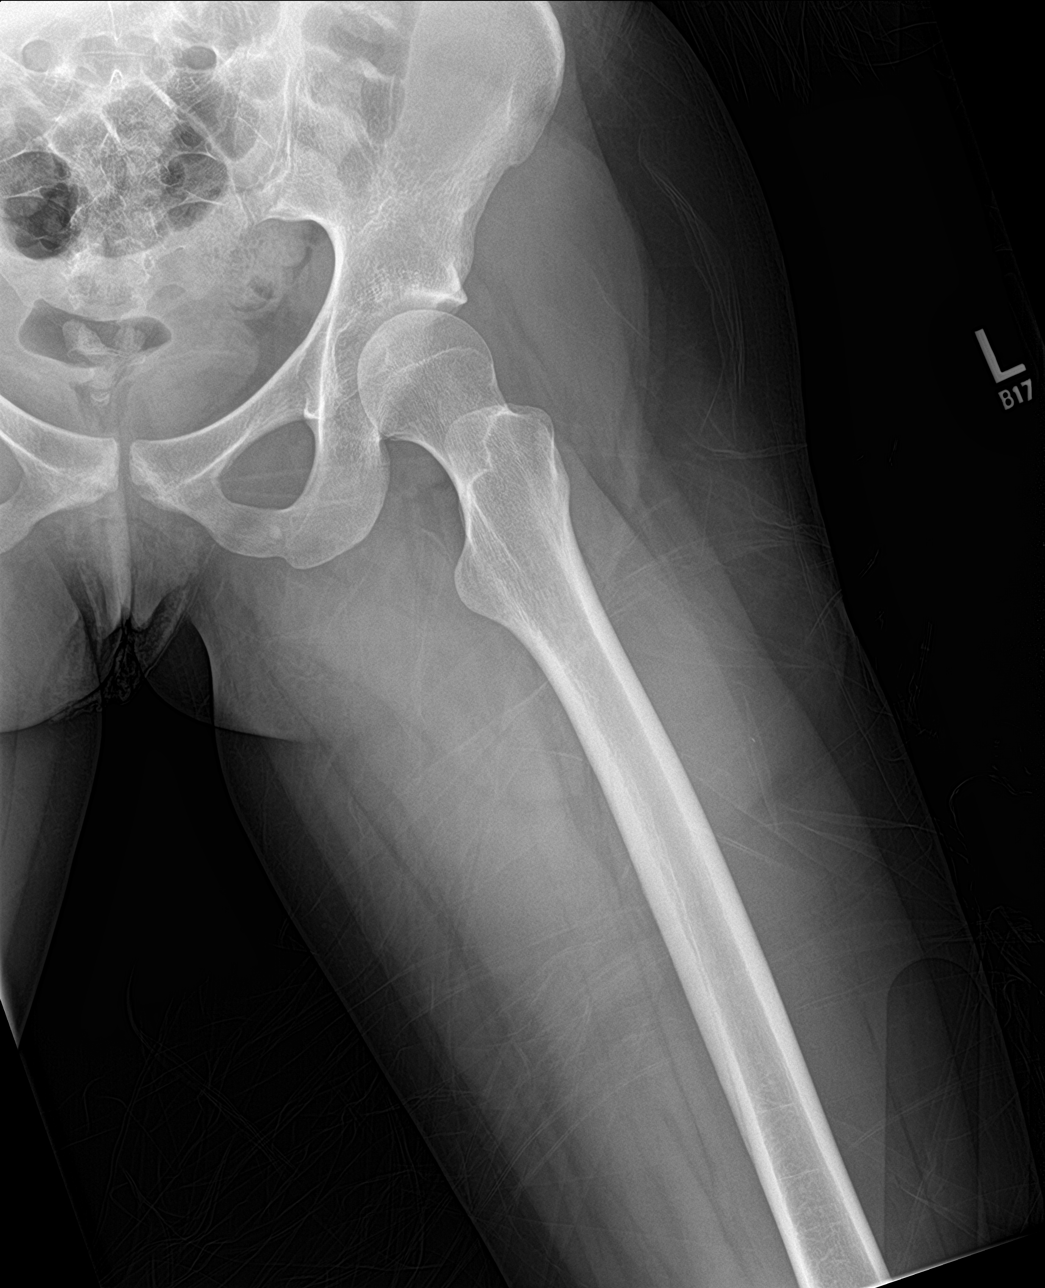

[femur lat (2 of 2)]
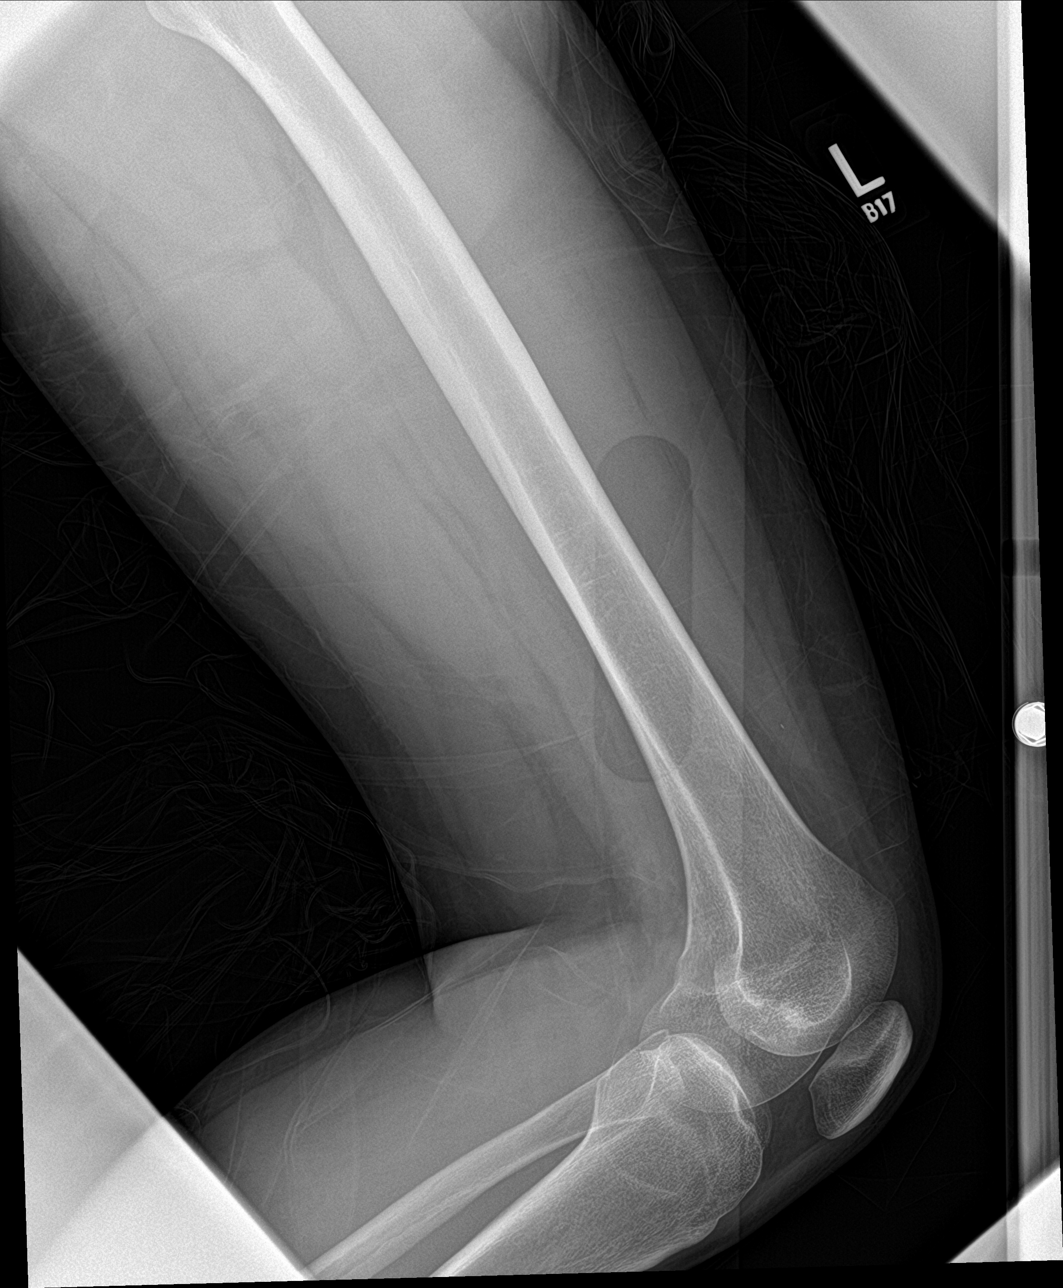

[4 of 4 positions shown; findings below may reference images not displayed]

FINDINGS: There is no evidence of fracture or other focal bone lesions. Soft
tissues are unremarkable.
IMPRESSION: Negative.

## 2019-06-21 IMAGING — DX DG PELVIS 1-2V
1 series · 1 of 1 positions shown · non-contrast
Comparison: None.

CLINICAL DATA: Fell off horse with pelvic pain hip pain

EXAM:
PELVIS - 1-2 VIEW

[pelvis ap]
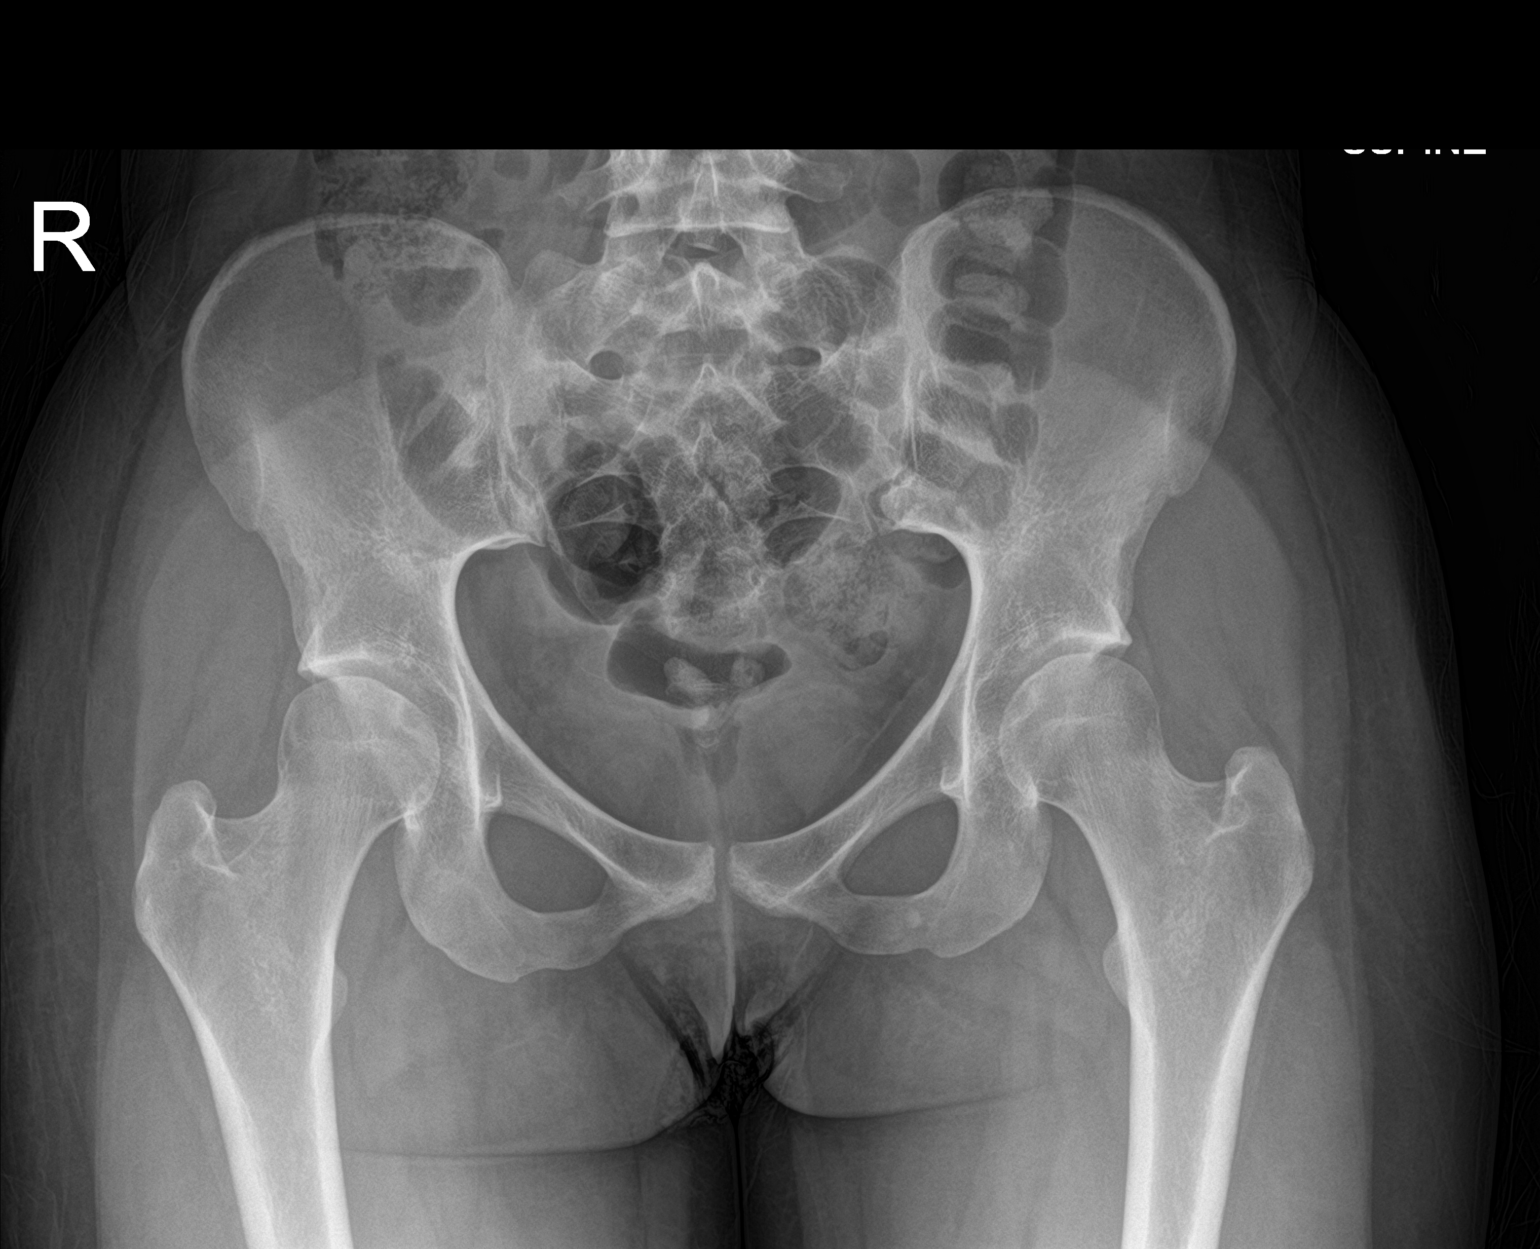

[1 of 1 positions shown; findings below may reference images not displayed]

FINDINGS: SI joints are non widened. Pubic symphysis and rami are intact. No
fracture or malalignment.
IMPRESSION: Negative.
